# Patient Record
Sex: Male | Born: 1960 | Race: Black or African American | Hispanic: No | Marital: Single | Smoking: Current some day smoker
Health system: Southern US, Community
[De-identification: ages and names within clinical notes are randomized; demographics above are authoritative.]

## PROBLEM LIST (undated history)

## (undated) DIAGNOSIS — I10 Essential (primary) hypertension: Secondary | ICD-10-CM

## (undated) HISTORY — PX: ANTERIOR CRUCIATE LIGAMENT REPAIR: SHX115

---

## 2015-06-11 ENCOUNTER — Emergency Department (HOSPITAL_COMMUNITY): Payer: Self-pay

## 2015-06-11 ENCOUNTER — Emergency Department (HOSPITAL_COMMUNITY)
Admission: EM | Admit: 2015-06-11 | Discharge: 2015-06-11 | Disposition: A | Discharge status: Home / Self Care | Payer: Self-pay | Attending: Emergency Medicine | Admitting: Emergency Medicine

## 2015-06-11 ENCOUNTER — Encounter (HOSPITAL_COMMUNITY): Payer: Self-pay

## 2015-06-11 DIAGNOSIS — Z79899 Other long term (current) drug therapy: Secondary | ICD-10-CM | POA: Insufficient documentation

## 2015-06-11 DIAGNOSIS — F17210 Nicotine dependence, cigarettes, uncomplicated: Secondary | ICD-10-CM | POA: Insufficient documentation

## 2015-06-11 DIAGNOSIS — J329 Chronic sinusitis, unspecified: Secondary | ICD-10-CM | POA: Insufficient documentation

## 2015-06-11 DIAGNOSIS — I10 Essential (primary) hypertension: Secondary | ICD-10-CM | POA: Insufficient documentation

## 2015-06-11 MED ORDER — AZITHROMYCIN 250 MG PO TABS: ORAL_TABLET | ORAL | Status: DC

## 2015-06-11 MED ORDER — FLUTICASONE PROPIONATE 50 MCG/ACT NA SUSP
2.0000 | Freq: Every day | NASAL | Status: AC
Start: 2015-06-11 — End: ?

## 2015-06-11 NOTE — ED Provider Notes (Signed)
CSN: [id number]     Arrival date & time 06/11/15  1232 History   First MD Initiated Contact with Patient 06/11/15 1517     Chief Complaint  Patient presents with  . Facial Pain  . Cough     (Consider location/radiation/quality/duration/timing/severity/associated sxs/prior Treatment) The history is provided by the patient.  Tristan DanceKeith Kelley is a 55 y.o. male here with runny nose, cough, chills. Patient states that For the last month or so he has been having intermittent cough. He has not productive cough mostly. He does have some subjective chills at home. Has persistent runny nose with some yellow greenish mucus. He started to have some facial pressure for the last week or so. He has been trying NyQuil as well as TheraFlu and Motrin with minimal relief. Denies any actual fevers or chest pain or vomiting or abdominal pain.         History reviewed. No pertinent past medical history. Past Surgical History  Procedure Laterality Date  . Anterior cruciate ligament repair     History reviewed. No pertinent family history. Social History  Substance Use Topics  . Smoking status: Current Some Day Smoker    Types: Cigarettes  . Smokeless tobacco: None  . Alcohol Use: Yes     Comment: occ    Review of Systems  HENT: Positive for congestion and facial swelling.   Respiratory: Positive for cough.   All other systems reviewed and are negative.     Allergies  Review of patient's allergies indicates no known allergies.  Home Medications   Prior to Admission medications   Medication Sig Start Date End Date Taking? Authorizing Provider  acetaminophen (TYLENOL) 500 MG tablet Take 500 mg by mouth every 6 (six) hours as needed for mild pain.   Yes Historical Provider, MD  aspirin-sod bicarb-citric acid (ALKA-SELTZER) 325 MG TBEF tablet Take 325 mg by mouth every 6 (six) hours as needed.   Yes Historical Provider, MD  ibuprofen (ADVIL,MOTRIN) 200 MG tablet Take 800 mg by mouth every 6 (six) hours  as needed for fever.   Yes Historical Provider, MD  Pheniramine-PE-APAP (THERAFLU FLU & SORE THROAT) 20-10-650 MG PACK Take 1 Package by mouth daily.   Yes Historical Provider, MD  Phenyleph-Doxylamine-DM-APAP (NYQUIL SEVERE COLD/FLU) 5-6.25-10-325 MG/15ML LIQD Take 1 capsule by mouth daily as needed (cold symptoms).   Yes Historical Provider, MD   BP 166/108 mmHg  Pulse 55  Temp(Src) 98.1 F (36.7 C) (Oral)  Resp 20  Ht 6\' 2"  (1.88 m)  Wt 227 lb 3.2 oz (103.057 kg)  BMI 29.16 kg/m2  SpO2 100% Physical Exam  Constitutional: He is oriented to person, place, and time. He appears well-developed and well-nourished.  HENT:  Head: Normocephalic.  Right Ear: External ear normal.  Left Ear: External ear normal.  Mouth/Throat: Oropharynx is clear and moist.  + L maxillary sinus tenderness   Eyes: Conjunctivae are normal. Pupils are equal, round, and reactive to light.  Neck: Normal range of motion. Neck supple.  Cardiovascular: Normal rate, regular rhythm and normal heart sounds.   Pulmonary/Chest: Effort normal.  + diminished L side   Abdominal: Soft. Bowel sounds are normal. He exhibits no distension. There is no tenderness. There is no rebound.  Musculoskeletal: Normal range of motion. He exhibits no edema or tenderness.  Neurological: He is alert and oriented to person, place, and time. No cranial nerve deficit. Coordination normal.  Skin: Skin is warm and dry.  Psychiatric: He has a normal mood and  affect. His behavior is normal. Judgment and thought content normal.  Nursing note and vitals reviewed.   ED Course  Procedures (including critical care time) Labs Review Labs Reviewed - No data to display  Imaging Review Dg Chest 2 View  06/11/2015  CLINICAL DATA:  Cough for 4 weeks EXAM: CHEST  2 VIEW COMPARISON:  None. FINDINGS: The heart size and mediastinal contours are within normal limits. Both lungs are clear. The visualized skeletal structures are unremarkable. IMPRESSION: No  active cardiopulmonary disease. Electronically Signed   By: Alcide Clever M.D.   On: 06/11/2015 16:17   I have personally reviewed and evaluated these images and lab results as part of my medical decision-making.   EKG Interpretation None      MDM   Final diagnoses:  None    Tristan Kelley is a 55 y.o. male here with cough, sinus congestion. Likely sinusitis vs pneumonia .Will get CXR. Also hypertensive but has never been hypertensive before but doesn't have PCP. Denies chest pain or shortness of breath or abdominal pain. No signs of hypertensive urgency. Will contact case management to get him outpatient f/u for BP check.   4:50 PM CXr clear. I think likely viral sinusitis vs atypical pneumonia. He is a smoker. Will dc home with zpack, flonase. Case manager able to get him list of resources to follow up BP. BP on discharge is 166/108. Will have him recheck BP in the office in a week.     Richardean Canal, MD 06/11/15 [phone]

## 2015-06-11 NOTE — ED Notes (Signed)
Onset 4 weeks cough-yellow phlegm, runny nose- yellow green mucus, chills, sweats and facial pain over forehead and cheeks.  Symptoms worsening.  No respiratory difficulties.  Has tried several OTC meds with no relief.

## 2015-06-11 NOTE — Care Management Note (Signed)
Case Management Note  Patient Details  Name: Manon HildingKeith Sealey MRN: [id number] Date of Birth: 1961-01-20  Subjective/Objective:  55 y.o. M seen in the ED on 06/11/2015 for runny nose, cough. EDP consulted CM for PCP referral.                   Action/Plan: [place] [facility]. [facility] of need for Follow up appt by In Basket,  as well as Allene Pyo Wood, RN ED CM (M-F) so that referral can be completed. Pt also given complete list of resources in this area and instructions to call providers and assess which, if any would best suit his needs, physically and financially. CM instructed pt to visit local drug store ([facility]) where Automatic BP Cuffs are located and take BP, ( same time daily for at least 7 days, record and have on hand for first MD visit. Pt verbalized no questions or concerns.    Expected Discharge Date:                  Expected Discharge Plan:     In-House Referral:     Discharge planning Services  CM Consult, Follow-up appt scheduled, [facility]  Post Acute Care Choice:    Choice offered to:  Patient  DME Arranged:    DME Agency:     HH Arranged:    HH Agency:     Status of Service:  Completed, signed off  Medicare Important Message Given:    Date Medicare IM Given:    Medicare IM give by:    Date Additional Medicare IM Given:    Additional Medicare Important Message give by:     If discussed at Long Length of Stay Meetings, dates discussed:    Additional Comments:  Yvone NeuCrutchfield, Adaia Matthies M, RN 06/11/2015, 4:02 PM

## 2015-06-11 NOTE — Discharge Instructions (Signed)
Take zpack as prescribed.   Take flonase daily for congestion.   Stop smoking.   Recheck blood pressure in the office in a week.   Return to ER if you have fever, trouble breathing, worse congestion or sinus pain.

## 2015-06-21 ENCOUNTER — Ambulatory Visit: Payer: Self-pay | Attending: Internal Medicine | Admitting: Internal Medicine

## 2015-06-21 ENCOUNTER — Encounter: Payer: Self-pay | Admitting: Internal Medicine

## 2015-06-21 VITALS — BP 179/121 | HR 59 | Temp 98.0°F | Resp 16 | Ht 74.0 in | Wt 230.0 lb

## 2015-06-21 DIAGNOSIS — F17210 Nicotine dependence, cigarettes, uncomplicated: Secondary | ICD-10-CM | POA: Insufficient documentation

## 2015-06-21 DIAGNOSIS — Z1211 Encounter for screening for malignant neoplasm of colon: Secondary | ICD-10-CM

## 2015-06-21 DIAGNOSIS — Z79899 Other long term (current) drug therapy: Secondary | ICD-10-CM | POA: Insufficient documentation

## 2015-06-21 DIAGNOSIS — I10 Essential (primary) hypertension: Secondary | ICD-10-CM | POA: Insufficient documentation

## 2015-06-21 DIAGNOSIS — Z7982 Long term (current) use of aspirin: Secondary | ICD-10-CM | POA: Insufficient documentation

## 2015-06-21 DIAGNOSIS — Z125 Encounter for screening for malignant neoplasm of prostate: Secondary | ICD-10-CM

## 2015-06-21 MED ORDER — AMLODIPINE BESYLATE 10 MG PO TABS: 10.0000 mg | ORAL_TABLET | Freq: Every day | ORAL | Status: AC

## 2015-06-21 MED ORDER — HYDROCHLOROTHIAZIDE 25 MG PO TABS: 25.0000 mg | ORAL_TABLET | Freq: Every day | ORAL | Status: AC

## 2015-06-21 NOTE — Progress Notes (Signed)
Patient here for follow up from the hospital Was seen for elevated blood pressure Presents in office with high blood pressure But with low pulse

## 2015-06-21 NOTE — Patient Instructions (Signed)
Tristan Kelley pharm clinic 1 wk for bp [employer] packet.  Hypertension Hypertension is another name for high blood pressure. High blood pressure forces your heart to work harder to pump blood. A blood pressure reading has two numbers, which includes a higher number over a lower number (example: 110/72). HOME CARE   Have your blood pressure rechecked by your doctor.  Only take medicine as told by your doctor. Follow the directions carefully. The medicine does not work as well if you skip doses. Skipping doses also puts you at risk for problems.  Do not smoke.  Monitor your blood pressure at home as told by your doctor. GET HELP IF:  You think you are having a reaction to the medicine you are taking.  You have repeat headaches or feel dizzy.  You have puffiness (swelling) in your ankles.  You have trouble with your vision. GET HELP RIGHT AWAY IF:   You get a very bad headache and are confused.  You feel weak, numb, or faint.  You get chest or belly (abdominal) pain.  You throw up (vomit).  You cannot breathe very well. MAKE SURE YOU:   Understand these instructions.  Will watch your condition.  Will get help right away if you are not doing well or get worse.   This information is not intended to replace advice given to you by your health care provider. Make sure you discuss any questions you have with your health care provider.   Document Released: 08/21/2007 Document Revised: 03/09/2013 Document Reviewed: 12/25/2012 Elsevier Interactive Patient Education 2016 Elsevier Inc.  - DASH Eating Plan DASH stands for "Dietary Approaches to Stop Hypertension." The DASH eating plan is a healthy eating plan that has been shown to reduce high blood pressure (hypertension). Additional health benefits may include reducing the risk of type 2 diabetes mellitus, heart disease, and stroke. The DASH eating plan may also help with weight loss. WHAT DO I NEED TO KNOW ABOUT THE DASH  EATING PLAN? For the DASH eating plan, you will follow these general guidelines:  Choose foods with a percent daily value for sodium of less than 5% (as listed on the food label).  Use salt-free seasonings or herbs instead of table salt or sea salt.  Check with your health care provider or pharmacist before using salt substitutes.  Eat lower-sodium products, often labeled as "lower sodium" or "no salt added."  Eat fresh foods.  Eat more vegetables, fruits, and low-fat dairy products.  Choose whole grains. Look for the word "whole" as the first word in the ingredient list.  Choose fish and skinless chicken or [place] more often than red meat. Limit fish, poultry, and meat to 6 oz (170 g) each day.  Limit sweets, desserts, sugars, and sugary drinks.  Choose heart-healthy fats.  Limit cheese to 1 oz (28 g) per day.  Eat more home-cooked food and less restaurant, buffet, and fast food.  Limit fried foods.  Cook foods using methods other than frying.  Limit canned vegetables. If you do use them, rinse them well to decrease the sodium.  When eating at a restaurant, ask that your food be prepared with less salt, or no salt if possible. WHAT FOODS CAN I EAT? Seek help from a dietitian for individual calorie needs. Grains Whole grain or whole wheat bread. Brown rice. Whole grain or whole wheat pasta. Quinoa, bulgur, and whole grain cereals. Low-sodium cereals. Corn or whole wheat flour tortillas. Whole grain cornbread. Whole grain crackers. Low-sodium  crackers. Vegetables Fresh or frozen vegetables (raw, steamed, roasted, or grilled). Low-sodium or reduced-sodium tomato and vegetable juices. Low-sodium or reduced-sodium tomato sauce and paste. Low-sodium or reduced-sodium canned vegetables.  Fruits All fresh, canned (in natural juice), or frozen fruits. Meat and Other Protein Products Ground beef (85% or leaner), grass-fed beef, or beef trimmed of fat. Skinless chicken or [place].  Ground chicken or [place]. Pork trimmed of fat. All fish and seafood. Eggs. Dried beans, peas, or lentils. Unsalted nuts and seeds. Unsalted canned beans. Dairy Low-fat dairy products, such as skim or 1% milk, 2% or reduced-fat cheeses, low-fat ricotta or cottage cheese, or plain low-fat yogurt. Low-sodium or reduced-sodium cheeses. Fats and Oils Tub margarines without trans fats. Light or reduced-fat mayonnaise and salad dressings (reduced sodium). Avocado. Safflower, olive, or canola oils. Natural peanut or almond butter. Other Unsalted popcorn and pretzels. The items listed above may not be a complete list of recommended foods or beverages. Contact your dietitian for more options. WHAT FOODS ARE NOT RECOMMENDED? Grains White bread. White pasta. White rice. Refined cornbread. Bagels and croissants. Crackers that contain trans fat. Vegetables Creamed or fried vegetables. Vegetables in a cheese sauce. Regular canned vegetables. Regular canned tomato sauce and paste. Regular tomato and vegetable juices. Fruits Dried fruits. Canned fruit in light or heavy syrup. Fruit juice. Meat and Other Protein Products Fatty cuts of meat. Ribs, chicken wings, bacon, sausage, bologna, salami, chitterlings, fatback, hot dogs, bratwurst, and packaged luncheon meats. Salted nuts and seeds. Canned beans with salt. Dairy Whole or 2% milk, cream, half-and-half, and cream cheese. Whole-fat or sweetened yogurt. Full-fat cheeses or blue cheese. Nondairy creamers and whipped toppings. Processed cheese, cheese spreads, or cheese curds. Condiments Onion and garlic salt, seasoned salt, table salt, and sea salt. Canned and packaged gravies. Worcestershire sauce. Tartar sauce. Barbecue sauce. Teriyaki sauce. Soy sauce, including reduced sodium. Steak sauce. Fish sauce. Oyster sauce. Cocktail sauce. Horseradish. Ketchup and mustard. Meat flavorings and tenderizers. Bouillon cubes. Hot sauce. Tabasco sauce. Marinades. Taco  seasonings. Relishes. Fats and Oils Butter, stick margarine, lard, shortening, ghee, and bacon fat. Coconut, palm kernel, or palm oils. Regular salad dressings. Other Pickles and olives. Salted popcorn and pretzels. The items listed above may not be a complete list of foods and beverages to avoid. Contact your dietitian for more information. WHERE CAN I FIND MORE INFORMATION? National Heart, Lung, and Blood Institute: [employer]it   This information is not intended to replace advice given to you by your health care provider. Make sure you discuss any questions you have with your health care provider.   Document Released: 02/21/2011 Document Revised: 03/25/2014 Document Reviewed: 01/06/2013 Elsevier Interactive Patient Education [employer]

## 2015-06-21 NOTE — Progress Notes (Signed)
Tristan Kelley, is a 55 y.o. male  EAV:[id number]  [id number]:[id number]  DOB - [birth year]  CC:  Chief Complaint  Patient presents with  . Hypertension       HPI: Tristan Kelley is a 55 y.o. male here today to establish medical care.  Patient was recently seen in the ED for acute sinusitis for which she was given a Z-Pak and Flonase on 06/11/2015. He feels better now. He is asymptomatic. He still uses Flonase occasionally for nasal congestion. He was in the ED, they found he had very high blood pressures and sent him to our clinic for further follow-up. 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  He wants to take care of himself better since he is getting older now.  He is interested in colonoscopy and PSA testing, wants to hold off on prostate exam at this time.  Smokes about 1 .5 pack tobacco a week.  Patient has No headache, No chest pain, No abdominal pain - No Nausea, No new weakness tingling or numbness, No Cough - SOB.  No Known Allergies History reviewed. No pertinent past medical history. Current Outpatient Prescriptions on File Prior to Visit  Medication Sig Dispense Refill  . acetaminophen (TYLENOL) 500 MG tablet Take 500 mg by mouth every 6 (six) hours as needed for mild pain.    Marland Kitchen aspirin-sod bicarb-citric acid (ALKA-SELTZER) 325 MG TBEF tablet Take 325 mg by mouth every 6 (six) hours as needed.    Marland Kitchen azithromycin (ZITHROMAX Z-PAK) 250 MG tablet 2 po day one, then 1 daily x 4 days 6 tablet 0  . fluticasone (FLONASE) 50 MCG/ACT nasal spray Place 2 sprays into both nostrils daily. 16 g 0  . ibuprofen (ADVIL,MOTRIN) 200 MG tablet Take 800 mg by mouth every 6 (six) hours as needed for fever.    . Pheniramine-PE-APAP (THERAFLU FLU & SORE THROAT) 20-10-650 MG PACK Take 1 Package by mouth daily.    Marland Kitchen Phenyleph-Doxylamine-DM-APAP (NYQUIL SEVERE COLD/FLU) 5-6.25-10-325 MG/15ML LIQD Take 1 capsule by mouth daily as needed  (cold symptoms).     No current facility-administered medications on file prior to visit.   History reviewed. No pertinent family history. Social History   Social History  . Marital Status: Single    Spouse Name: N/A  . Number of Children: N/A  . Years of Education: N/A   Occupational History  . Not on file.   Social History Main Topics  . Smoking status: Current Some Day Smoker    Types: Cigarettes  . Smokeless tobacco: Not on file  . Alcohol Use: Yes     Comment: occ  . Drug Use: No  . Sexual Activity: Not on file   Other Topics Concern  . Not on file   Social History Narrative    Review of Systems: Constitutional: Negative for fever, chills, diaphoresis, activity change, appetite change and fatigue. HENT: Negative for ear pain, nosebleeds, congestion, facial swelling, rhinorrhea, neck pain, neck stiffness and ear discharge.  Eyes: Negative for pain, discharge, redness, itching and visual disturbance. Respiratory: Negative for cough, choking, chest tightness, shortness of breath, wheezing and stridor.  Cardiovascular: Negative for chest pain, palpitations and leg swelling. Gastrointestinal: Negative for abdominal distention. Genitourinary: Negative for dysuria, urgency, frequency, hematuria, flank pain, decreased urine volume, difficulty urinating and dyspareunia.  Musculoskeletal: Negative for back pain, joint swelling, arthralgia and gait problem. Neurological: Negative for dizziness, tremors, seizures, syncope, facial asymmetry, speech  difficulty, weakness, light-headedness, numbness and headaches.  Hematological: Negative for adenopathy. Does not bruise/bleed easily. Psychiatric/Behavioral: Negative for hallucinations, behavioral problems, confusion, dysphoric mood, decreased concentration and agitation.    Objective:   Filed Vitals:   06/21/15 1029 06/21/15 1100  BP: 192/124 179/121  Pulse: 60 59  Temp: 98 F (36.7 C)   Resp: 16    bmi 29.5  Physical  Exam: Constitutional: Patient appears well-developed and well-nourished. No distress. AAOx3, AAM, pleasant HENT: Normocephalic, atraumatic, External right and left ear normal. Oropharynx is clear and moist.  Eyes: Conjunctivae and EOM are normal. PERRL, no scleral icterus. Neck: Normal ROM. Neck supple. No JVD. No tracheal deviation.  CVS: RRR, S1/S2 +, no murmurs, no gallops, no carotid bruit.  Pulmonary: Effort and breath sounds normal, no stridor, rhonchi, wheezes, rales.  Abdominal: Soft. BS +, no distension, tenderness, rebound or guarding.  Musculoskeletal: Normal range of motion. No edema and no tenderness.  Lymphadenopathy: No lymphadenopathy noted, cervical, Neuro: Alert.  muscle tone coordination. No cranial nerve deficit grossly. Skin: Skin is warm and dry. No rash noted. Not diaphoretic. No erythema. No pallor. Psychiatric: Normal mood and affect. Behavior, judgment, thought content normal.  No results found for: WBC, HGB, HCT, MCV, PLT No results found for: CREATININE, BUN, NA, K, CL, CO2  No results found for: HGBA1C Lipid Panel  No results found for: CHOL, TRIG, HDL, CHOLHDL, VLDL, LDLCALC     Assessment and plan:   1. HTN (hypertension), malignant - will start on norvasc 10 and hctz 25 qd - [facility]  1 wk for bp chk - chk bmp, lipid panel, cbc - We have discussed target BP range and blood pressure goal. -  I have advised patient to check BP regularly and to call us back or report to clinic if the numbers are consistently higher than 140/90.  - We discussed the importance of compliance with medical therapy and DASH diet recommended, consequences of uncontrolled hypertension discussed.    2. Prostate cancer screening - pt declining rectal exam at this time; PSA testing discussed w/ patient, including sens/specificitiy, and he is willing to get tested with PSA at this time.  3. Colon cancer screening - Ambulatory referral to Gastroenterology -  colonoscopy  4. Sinusitis - resolved  Financial services, need orange card. chk vit D levels as well, health maintenance    Return in about 3 months (around 09/20/2015).  The patient was given clear instructions to go to ER or return to medical center if symptoms don't improve, worsen or new problems develop. The patient verbalized understanding. The patient was told to call to get lab results if they haven't heard anything in the next week.      Pete Glatterawn T Ladonte Verstraete, MD, MBA/MHA [facility] [place] [phone]   06/21/2015, 1:57 PM

## 2015-06-28 ENCOUNTER — Encounter: Payer: Self-pay | Admitting: Pharmacist

## 2015-06-28 ENCOUNTER — Ambulatory Visit: Payer: Self-pay | Attending: Internal Medicine | Admitting: Pharmacist

## 2015-06-28 VITALS — BP 131/87 | HR 64

## 2015-06-28 DIAGNOSIS — I10 Essential (primary) hypertension: Secondary | ICD-10-CM

## 2015-06-28 NOTE — Patient Instructions (Signed)
Thanks for coming to see Tristan Kelley!  Continue to take your medications as prescribed  Follow up with Dr. Julien NordmannLangeland in 3 months  DASH Eating Plan DASH stands for "Dietary Approaches to Stop Hypertension." The DASH eating plan is a healthy eating plan that has been shown to reduce high blood pressure (hypertension). Additional health benefits may include reducing the risk of type 2 diabetes mellitus, heart disease, and stroke. The DASH eating plan may also help with weight loss. WHAT DO I NEED TO KNOW ABOUT THE DASH EATING PLAN? For the DASH eating plan, you will follow these general guidelines:  Choose foods with a percent daily value for sodium of less than 5% (as listed on the food label).  Use salt-free seasonings or herbs instead of table salt or sea salt.  Check with your health care provider or pharmacist before using salt substitutes.  Eat lower-sodium products, often labeled as "lower sodium" or "no salt added."  Eat fresh foods.  Eat more vegetables, fruits, and low-fat dairy products.  Choose whole grains. Look for the word "whole" as the first word in the ingredient list.  Choose fish and skinless chicken or [place] more often than red meat. Limit fish, poultry, and meat to 6 oz (170 g) each day.  Limit sweets, desserts, sugars, and sugary drinks.  Choose heart-healthy fats.  Limit cheese to 1 oz (28 g) per day.  Eat more home-cooked food and less restaurant, buffet, and fast food.  Limit fried foods.  Cook foods using methods other than frying.  Limit canned vegetables. If you do use them, rinse them well to decrease the sodium.  When eating at a restaurant, ask that your food be prepared with less salt, or no salt if possible. WHAT FOODS CAN I EAT? Seek help from a dietitian for individual calorie needs. Grains Whole grain or whole wheat bread. Brown rice. Whole grain or whole wheat pasta. Quinoa, bulgur, and whole grain cereals. Low-sodium cereals. Corn or whole  wheat flour tortillas. Whole grain cornbread. Whole grain crackers. Low-sodium crackers. Vegetables Fresh or frozen vegetables (raw, steamed, roasted, or grilled). Low-sodium or reduced-sodium tomato and vegetable juices. Low-sodium or reduced-sodium tomato sauce and paste. Low-sodium or reduced-sodium canned vegetables.  Fruits All fresh, canned (in natural juice), or frozen fruits. Meat and Other Protein Products Ground beef (85% or leaner), grass-fed beef, or beef trimmed of fat. Skinless chicken or [place]. Ground chicken or [place]. Pork trimmed of fat. All fish and seafood. Eggs. Dried beans, peas, or lentils. Unsalted nuts and seeds. Unsalted canned beans. Dairy Low-fat dairy products, such as skim or 1% milk, 2% or reduced-fat cheeses, low-fat ricotta or cottage cheese, or plain low-fat yogurt. Low-sodium or reduced-sodium cheeses. Fats and Oils Tub margarines without trans fats. Light or reduced-fat mayonnaise and salad dressings (reduced sodium). Avocado. Safflower, olive, or canola oils. Natural peanut or almond butter. Other Unsalted popcorn and pretzels. The items listed above may not be a complete list of recommended foods or beverages. Contact your dietitian for more options. WHAT FOODS ARE NOT RECOMMENDED? Grains White bread. White pasta. White rice. Refined cornbread. Bagels and croissants. Crackers that contain trans fat. Vegetables Creamed or fried vegetables. Vegetables in a cheese sauce. Regular canned vegetables. Regular canned tomato sauce and paste. Regular tomato and vegetable juices. Fruits Dried fruits. Canned fruit in light or heavy syrup. Fruit juice. Meat and Other Protein Products Fatty cuts of meat. Ribs, chicken wings, bacon, sausage, bologna, salami, chitterlings, fatback, hot dogs, bratwurst, and packaged luncheon  meats. Salted nuts and seeds. Canned beans with salt. Dairy Whole or 2% milk, cream, half-and-half, and cream cheese. Whole-fat or sweetened yogurt.  Full-fat cheeses or blue cheese. Nondairy creamers and whipped toppings. Processed cheese, cheese spreads, or cheese curds. Condiments Onion and garlic salt, seasoned salt, table salt, and sea salt. Canned and packaged gravies. Worcestershire sauce. Tartar sauce. Barbecue sauce. Teriyaki sauce. Soy sauce, including reduced sodium. Steak sauce. Fish sauce. Oyster sauce. Cocktail sauce. Horseradish. Ketchup and mustard. Meat flavorings and tenderizers. Bouillon cubes. Hot sauce. Tabasco sauce. Marinades. Taco seasonings. Relishes. Fats and Oils Butter, stick margarine, lard, shortening, ghee, and bacon fat. Coconut, palm kernel, or palm oils. Regular salad dressings. Other Pickles and olives. Salted popcorn and pretzels. The items listed above may not be a complete list of foods and beverages to avoid. Contact your dietitian for more information. WHERE CAN I FIND MORE INFORMATION? National Heart, Lung, and Blood Institute: [employer]   This information is not intended to replace advice given to you by your health care provider. Make sure you discuss any questions you have with your health care provider.   Document Released: 02/21/2011 Document Revised: 03/25/2014 Document Reviewed: 01/06/2013 Elsevier Interactive Patient Education 2016 [employer]. Hypertension Hypertension is another name for high blood pressure. High blood pressure forces your heart to work harder to pump blood. A blood pressure reading has two numbers, which includes a higher number over a lower number (example: 110/72). HOME CARE   Have your blood pressure rechecked by your doctor.  Only take medicine as told by your doctor. Follow the directions carefully. The medicine does not work as well if you skip doses. Skipping doses also puts you at risk for problems.  Do not smoke.  Monitor your blood pressure at home as told by your doctor. GET HELP IF:  You think you are having a  reaction to the medicine you are taking.  You have repeat headaches or feel dizzy.  You have puffiness (swelling) in your ankles.  You have trouble with your vision. GET HELP RIGHT AWAY IF:   You get a very bad headache and are confused.  You feel weak, numb, or faint.  You get chest or belly (abdominal) pain.  You throw up (vomit).  You cannot breathe very well. MAKE SURE YOU:   Understand these instructions.  Will watch your condition.  Will get help right away if you are not doing well or get worse.   This information is not intended to replace advice given to you by your health care provider. Make sure you discuss any questions you have with your health care provider.   Document Released: 08/21/2007 Document Revised: 03/09/2013 Document Reviewed: 12/25/2012 Elsevier Interactive Patient Education [employer]

## 2015-06-28 NOTE — Progress Notes (Signed)
S:    Patient arrives in good spirits.    Presents to the clinic for hypertension evaluation. Patient was referred on 06/21/15 by Dr. Julien NordmannLangeland.  Patient was last seen by Primary Care Provider on 06/21/15.   Patient reports adherence with medications.  Current BP Medications include:  Amlodipine 10 mg daily and HCTZ 25 mg daily.     O:   Last 3 Office BP readings: BP Readings from Last 3 Encounters:  06/28/15 131/87  06/21/15 179/121  06/11/15 173/95    BMET No results found for: NA, K, CL, CO2, GLUCOSE, BUN, CREATININE, CALCIUM, GFRNONAA, GFRAA  A/P: Hypertension newly diagnosed currently controlled on current medications.  Continued amlodipine 10 mg daily and HCTZ 25 mg daily.   Results reviewed and written information provided on hypertension and the DASH diet.   Total time in face-to-face counseling 10 minutes.   F/U Clinic Visit with Dr. Julien NordmannLangeland as directed.  Patient seen with Theresa MulliganMartin Shaughnessy, PharmD Candidate

## 2015-07-03 ENCOUNTER — Telehealth: Payer: Self-pay | Admitting: Internal Medicine

## 2015-07-03 NOTE — Telephone Encounter (Signed)
Talked to his mom, pt was not home.  He went to ED on Fri 4/14 after called w/ RN for s/sx weakness/dizzy.  Per mom he is feeling better.  2) 2nd call: Gave me his cell/ [phone], I was able to talk to him.  [facility], found to be hypertensive, gave him a rx for clonidine as well, prn.  He is doing much better, has a blood pressure cuff now. I instructed him to continue taking the norvasc 10 and hctz 5, only take the clonidine as needed if his sbp remains elavated >180s after 30mins of taking his oral scheduled meds. He will make appt w/ Staci RighterStacey Pharm or myself in next week.

## 2015-09-05 ENCOUNTER — Emergency Department (HOSPITAL_COMMUNITY): Payer: Self-pay

## 2015-09-05 ENCOUNTER — Encounter (HOSPITAL_COMMUNITY): Payer: Self-pay | Admitting: Emergency Medicine

## 2015-09-05 ENCOUNTER — Emergency Department (HOSPITAL_COMMUNITY)
Admission: EM | Admit: 2015-09-05 | Discharge: 2015-09-06 | Disposition: A | Discharge status: Home / Self Care | Payer: Self-pay | Attending: Emergency Medicine | Admitting: Emergency Medicine

## 2015-09-05 DIAGNOSIS — Y93G3 Activity, cooking and baking: Secondary | ICD-10-CM | POA: Insufficient documentation

## 2015-09-05 DIAGNOSIS — I10 Essential (primary) hypertension: Secondary | ICD-10-CM | POA: Insufficient documentation

## 2015-09-05 DIAGNOSIS — Z7982 Long term (current) use of aspirin: Secondary | ICD-10-CM | POA: Insufficient documentation

## 2015-09-05 DIAGNOSIS — S61216A Laceration without foreign body of right little finger without damage to nail, initial encounter: Secondary | ICD-10-CM | POA: Insufficient documentation

## 2015-09-05 DIAGNOSIS — S41111A Laceration without foreign body of right upper arm, initial encounter: Secondary | ICD-10-CM

## 2015-09-05 DIAGNOSIS — Y929 Unspecified place or not applicable: Secondary | ICD-10-CM | POA: Insufficient documentation

## 2015-09-05 DIAGNOSIS — Y990 Civilian activity done for income or pay: Secondary | ICD-10-CM | POA: Insufficient documentation

## 2015-09-05 DIAGNOSIS — F17210 Nicotine dependence, cigarettes, uncomplicated: Secondary | ICD-10-CM | POA: Insufficient documentation

## 2015-09-05 DIAGNOSIS — W260XXA Contact with knife, initial encounter: Secondary | ICD-10-CM | POA: Insufficient documentation

## 2015-09-05 HISTORY — DX: Essential (primary) hypertension: I10

## 2015-09-05 MED ORDER — TETANUS-DIPHTH-ACELL PERTUSSIS 5-2.5-18.5 LF-MCG/0.5 IM SUSP
0.5000 mL | Freq: Once | INTRAMUSCULAR | Status: AC
  Administered 2015-09-05: 0.5 mL via INTRAMUSCULAR
  Filled 2015-09-05: qty 0.5

## 2015-09-05 MED ORDER — LIDOCAINE HCL 2 % IJ SOLN
10.0000 mL | Freq: Once | INTRAMUSCULAR | Status: AC
  Administered 2015-09-05: 200 mg
  Filled 2015-09-05: qty 20

## 2015-09-05 NOTE — ED Notes (Signed)
Pt st's he was slicing a piece of roast beef and accidentally cut his finger.  Pt has lac to tip of right 5th finger

## 2015-09-05 NOTE — ED Notes (Signed)
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 Unknown tetanus. Pt in NAD, bleeding controlled. Pt laceration to R pinkie finger. Full ROM and sensation intact

## 2015-09-05 NOTE — ED Provider Notes (Signed)
CSN: [id number]     Arrival date & time 09/05/15  2251 History   By signing my name below, I, Marisue Humble, attest that this documentation has been prepared under the direction and in the presence of non-physician practitioner, Melton Krebs, [place]. Electronically Signed: Marisue Humble, Scribe. 09/05/2015. 11:55 PM.   Chief Complaint  Patient presents with  . Extremity Laceration    The history is provided by the patient. No language interpreter was used.   HPI Comments:  Tristan Kelley is a 55 y.o. male with PMHx of HTN who presents to the Emergency Department complaining of laceration to tip of right pinky finger. Pt sliced his finger just PTA with a knife while cutting meat. He states the nail may have come up. Bleeding controlled PTA. Tetanus out of date.  Past Medical History  Diagnosis Date  . Hypertension    Past Surgical History  Procedure Laterality Date  . Anterior cruciate ligament repair     No family history on file. Social History  Substance Use Topics  . Smoking status: Current Some Day Smoker    Types: Cigarettes  . Smokeless tobacco: None  . Alcohol Use: Yes     Comment: occ    Review of Systems 10 systems reviewed and all are negative for acute change except as noted in the HPI.   Allergies  Review of patient's allergies indicates no known allergies.  Home Medications   Prior to Admission medications   Medication Sig Start Date End Date Taking? Authorizing Provider  acetaminophen (TYLENOL) 500 MG tablet Take 500 mg by mouth every 6 (six) hours as needed for mild pain.    Historical Provider, MD  amLODipine (NORVASC) 10 MG tablet Take 1 tablet (10 mg total) by mouth daily. 06/21/15   Pete Glatter, MD  aspirin-sod bicarb-citric acid (ALKA-SELTZER) 325 MG TBEF tablet Take 325 mg by mouth every 6 (six) hours as needed.    Historical Provider, MD  fluticasone (FLONASE) 50 MCG/ACT nasal spray Place 2 sprays into both nostrils daily. 06/11/15   Richardean Canal, MD  hydrochlorothiazide (HYDRODIURIL) 25 MG tablet Take 1 tablet (25 mg total) by mouth daily. 06/21/15   Pete Glatter, MD  ibuprofen (ADVIL,MOTRIN) 200 MG tablet Take 800 mg by mouth every 6 (six) hours as needed for fever. Reported on 06/28/2015    Historical Provider, MD  Pheniramine-PE-APAP (THERAFLU FLU & SORE THROAT) 20-10-650 MG PACK Take 1 Package by mouth daily. Reported on 06/28/2015    Historical Provider, MD  Phenyleph-Doxylamine-DM-APAP (NYQUIL SEVERE COLD/FLU) 5-6.25-10-325 MG/15ML LIQD Take 1 capsule by mouth daily as needed (cold symptoms). Reported on 06/28/2015    Historical Provider, MD   BP 149/102 mmHg  Pulse 68  Temp(Src) 98.2 F (36.8 C) (Oral)  Resp 18  SpO2 97%   Physical Exam  Constitutional: He is oriented to person, place, and time. He appears well-developed and well-nourished.  HENT:  Head: Normocephalic and atraumatic.  Eyes: EOM are normal.  Neck: Normal range of motion.  Cardiovascular: Normal rate.   Pulmonary/Chest: Effort normal. No respiratory distress.  Abdominal: He exhibits no distension.  Musculoskeletal: Normal range of motion.  NVI BL  Neurological: He is alert and oriented to person, place, and time.  Skin: Skin is warm and dry. No rash noted. No erythema.  3.5 cm laceration at right 5th digit. No nailbed involvement. Bleeding controlled.   Psychiatric: He has a normal mood and affect.  Nursing note and vitals reviewed.  ED Course  .Marland Kitchen.Laceration Repair Date/Time: 09/05/2015 11:45 PM Performed by: Althea GrimmerILEY, Sagan Maselli NICOLE Authorized by: Melton KrebsILEY, Callan Norden NICOLE Consent: Verbal consent obtained. Risks and benefits: risks, benefits and alternatives were discussed Consent given by: patient Patient understanding: patient states understanding of the procedure being performed Patient identity confirmed: verbally with patient and arm band Body area: upper extremity Location details: right small finger Laceration length: 3.5 cm Foreign  bodies: no foreign bodies Tendon involvement: none Nerve involvement: none Vascular damage: no Anesthesia: digital block Local anesthetic: lidocaine 2% without epinephrine Anesthetic total: 6 ml Patient sedated: no Preparation: Patient was prepped and draped in the usual sterile fashion. Irrigation solution: saline (1 liter NS) Irrigation method: 60 cc syringe with combiguard. Amount of cleaning: extensive Skin closure: 5-0 Prolene Number of sutures: 7 Technique: simple Approximation: close Approximation difficulty: simple Dressing: antibiotic ointment and gauze roll Patient tolerance: Patient tolerated the procedure well with no immediate complications  Irrigation Date/Time: 09/05/2015 11:30 PM Performed by: Althea GrimmerILEY, Jalasia Eskridge NICOLE Authorized by: Althea GrimmerILEY, Worth Kober NICOLE Consent: Verbal consent obtained. Risks and benefits: risks, benefits and alternatives were discussed Consent given by: patient Patient understanding: patient states understanding of the procedure being performed Patient identity confirmed: verbally with patient and arm band Local anesthesia used: yes Anesthesia: digital block Local anesthetic: lidocaine 2% without epinephrine Patient sedated: no Patient tolerance: Patient tolerated the procedure well with no immediate complications     DIAGNOSTIC STUDIES: Oxygen Saturation is 97% on RA, normal by my interpretation.    COORDINATION OF CARE: 11:13 PM Will order x-ray of right pinky finger and repair laceration. Discussed treatment plan with pt at bedside and pt agreed to plan.  Imaging Review Dg Finger Little Right  09/05/2015  CLINICAL DATA:  Cut distal rt little finger phalanx with knife while cutting meat EXAM: RIGHT LITTLE FINGER 2+V COMPARISON:  None. FINDINGS: There is a linear cortical fragment anterior to the tuft of the right little finger. Regional soft tissue swelling. No involvement of the subchondral cortex. Otherwise normal mineralization and  alignment. No significant osseous degenerative change. No dislocation. IMPRESSION: Cortical injury at the palmar aspect of the tuft distal phalanx right little finger. Electronically Signed   By: Corlis Leak  Hassell M.D.   On: 09/05/2015 23:41   I have personally reviewed and evaluated these images and lab results as part of my medical decision-making.  MDM   Final diagnoses:  Laceration of upper extremity, right, initial encounter   Patient with open fracture. Consulted Dr. Merlyn LotKuzma who advised bactrim and office follow-up.   Pressure irrigation performed. Wound explored and base of wound visualized in a bloodless field without evidence of foreign body.  Laceration occurred < 8 hours prior to repair which was well tolerated. Tdap updated.  Pt has no comorbidities to effect normal wound healing.  Discussed suture home care with patient and answered questions. Patient may be safely discharged home with bactrim. Discussed reasons for return. Patient to follow-up with Dr. Merrilee SeashoreKuzma's office tomorrow. Patient in understanding and agreement with the plan. I personally performed the services described in this documentation, which was scribed in my presence. The recorded information has been reviewed and is accurate.   Melton KrebsSamantha Nicole Tuyen Uncapher, PA-C 09/06/15 2221  Margarita Grizzleanielle Ray, MD 09/08/15 1247

## 2015-09-06 MED ORDER — SULFAMETHOXAZOLE-TRIMETHOPRIM 800-160 MG PO TABS
1.0000 | ORAL_TABLET | Freq: Two times a day (BID) | ORAL | Status: AC
Start: 2015-09-06 — End: 2015-09-13

## 2015-09-06 NOTE — Discharge Instructions (Signed)
Mr. Tristan Kelley,  Nice meeting you! Please follow-up with Dr. Merlyn LotKuzma. Return to the emergency department if you develop fevers, chills, swelling, numbness/tingling, inability to move your finger. Feel better soon!  S. Lane HackerNicole Annelisa Ryback, PA-C Laceration Care, Adult A laceration is a cut that goes through all of the layers of the skin and into the tissue that is right under the skin. Some lacerations heal on their own. Others need to be closed with stitches (sutures), staples, skin adhesive strips, or skin glue. Proper laceration care minimizes the risk of infection and helps the laceration to heal better. HOW TO CARE FOR YOUR LACERATION If sutures or staples were used:  Keep the wound clean and dry.  If you were given a bandage (dressing), you should change it at least one time per day or as told by your health care provider. You should also change it if it becomes wet or dirty.  Keep the wound completely dry for the first 24 hours or as told by your health care provider. After that time, you may shower or bathe. However, make sure that the wound is not soaked in water until after the sutures or staples have been removed.  Clean the wound one time each day or as told by your health care provider:  Wash the wound with soap and water.  Rinse the wound with water to remove all soap.  Pat the wound dry with a clean towel. Do not rub the wound.  After cleaning the wound, apply a thin layer of antibiotic ointmentas told by your health care provider. This will help to prevent infection and keep the dressing from sticking to the wound.  Have the sutures or staples removed as told by your health care provider. If skin adhesive strips were used:  Keep the wound clean and dry.  If you were given a bandage (dressing), you should change it at least one time per day or as told by your health care provider. You should also change it if it becomes dirty or wet.  Do not get the skin adhesive strips wet.  You may shower or bathe, but be careful to keep the wound dry.  If the wound gets wet, pat it dry with a clean towel. Do not rub the wound.  Skin adhesive strips fall off on their own. You may trim the strips as the wound heals. Do not remove skin adhesive strips that are still stuck to the wound. They will fall off in time. If skin glue was used:  Try to keep the wound dry, but you may briefly wet it in the shower or bath. Do not soak the wound in water, such as by swimming.  After you have showered or bathed, gently pat the wound dry with a clean towel. Do not rub the wound.  Do not do any activities that will make you sweat heavily until the skin glue has fallen off on its own.  Do not apply liquid, cream, or ointment medicine to the wound while the skin glue is in place. Using those may loosen the film before the wound has healed.  If you were given a bandage (dressing), you should change it at least one time per day or as told by your health care provider. You should also change it if it becomes dirty or wet.  If a dressing is placed over the wound, be careful not to apply tape directly over the skin glue. Doing that may cause the glue to be pulled  off before the wound has healed.  Do not pick at the glue. The skin glue usually remains in place for 5-10 days, then it falls off of the skin. General Instructions  Take over-the-counter and prescription medicines only as told by your health care provider.  If you were prescribed an antibiotic medicine or ointment, take or apply it as told by your doctor. Do not stop using it even if your condition improves.  To help prevent scarring, make sure to cover your wound with sunscreen whenever you are outside after stitches are removed, after adhesive strips are removed, or when glue remains in place and the wound is healed. Make sure to wear a sunscreen of at least 30 SPF.  Do not scratch or pick at the wound.  Keep all follow-up visits as  told by your health care provider. This is important.  Check your wound every day for signs of infection. Watch for:  Redness, swelling, or pain.  Fluid, blood, or pus.  Raise (elevate) the injured area above the level of your heart while you are sitting or lying down, if possible. SEEK MEDICAL CARE IF:  You received a tetanus shot and you have swelling, severe pain, redness, or bleeding at the injection site.  You have a fever.  A wound that was closed breaks open.  You notice a bad smell coming from your wound or your dressing.  You notice something coming out of the wound, such as wood or glass.  Your pain is not controlled with medicine.  You have increased redness, swelling, or pain at the site of your wound.  You have fluid, blood, or pus coming from your wound.  You notice a change in the color of your skin near your wound.  You need to change the dressing frequently due to fluid, blood, or pus draining from the wound.  You develop a new rash.  You develop numbness around the wound. SEEK IMMEDIATE MEDICAL CARE IF:  You develop severe swelling around the wound.  Your pain suddenly increases and is severe.  You develop painful lumps near the wound or on skin that is anywhere on your body.  You have a red streak going away from your wound.  The wound is on your hand or foot and you cannot properly move a finger or toe.  The wound is on your hand or foot and you notice that your fingers or toes look pale or bluish.   This information is not intended to replace advice given to you by your health care provider. Make sure you discuss any questions you have with your health care provider.   Document Released: 03/04/2005 Document Revised: 07/19/2014 Document Reviewed: 02/28/2014 Elsevier Interactive Patient Education [employer]

## 2017-04-11 IMAGING — DX DG CHEST 2V
2 series · 2 of 2 positions shown · non-contrast
Comparison: None.

CLINICAL DATA: Cough for 4 weeks

EXAM:
CHEST  2 VIEW

[chest pa]
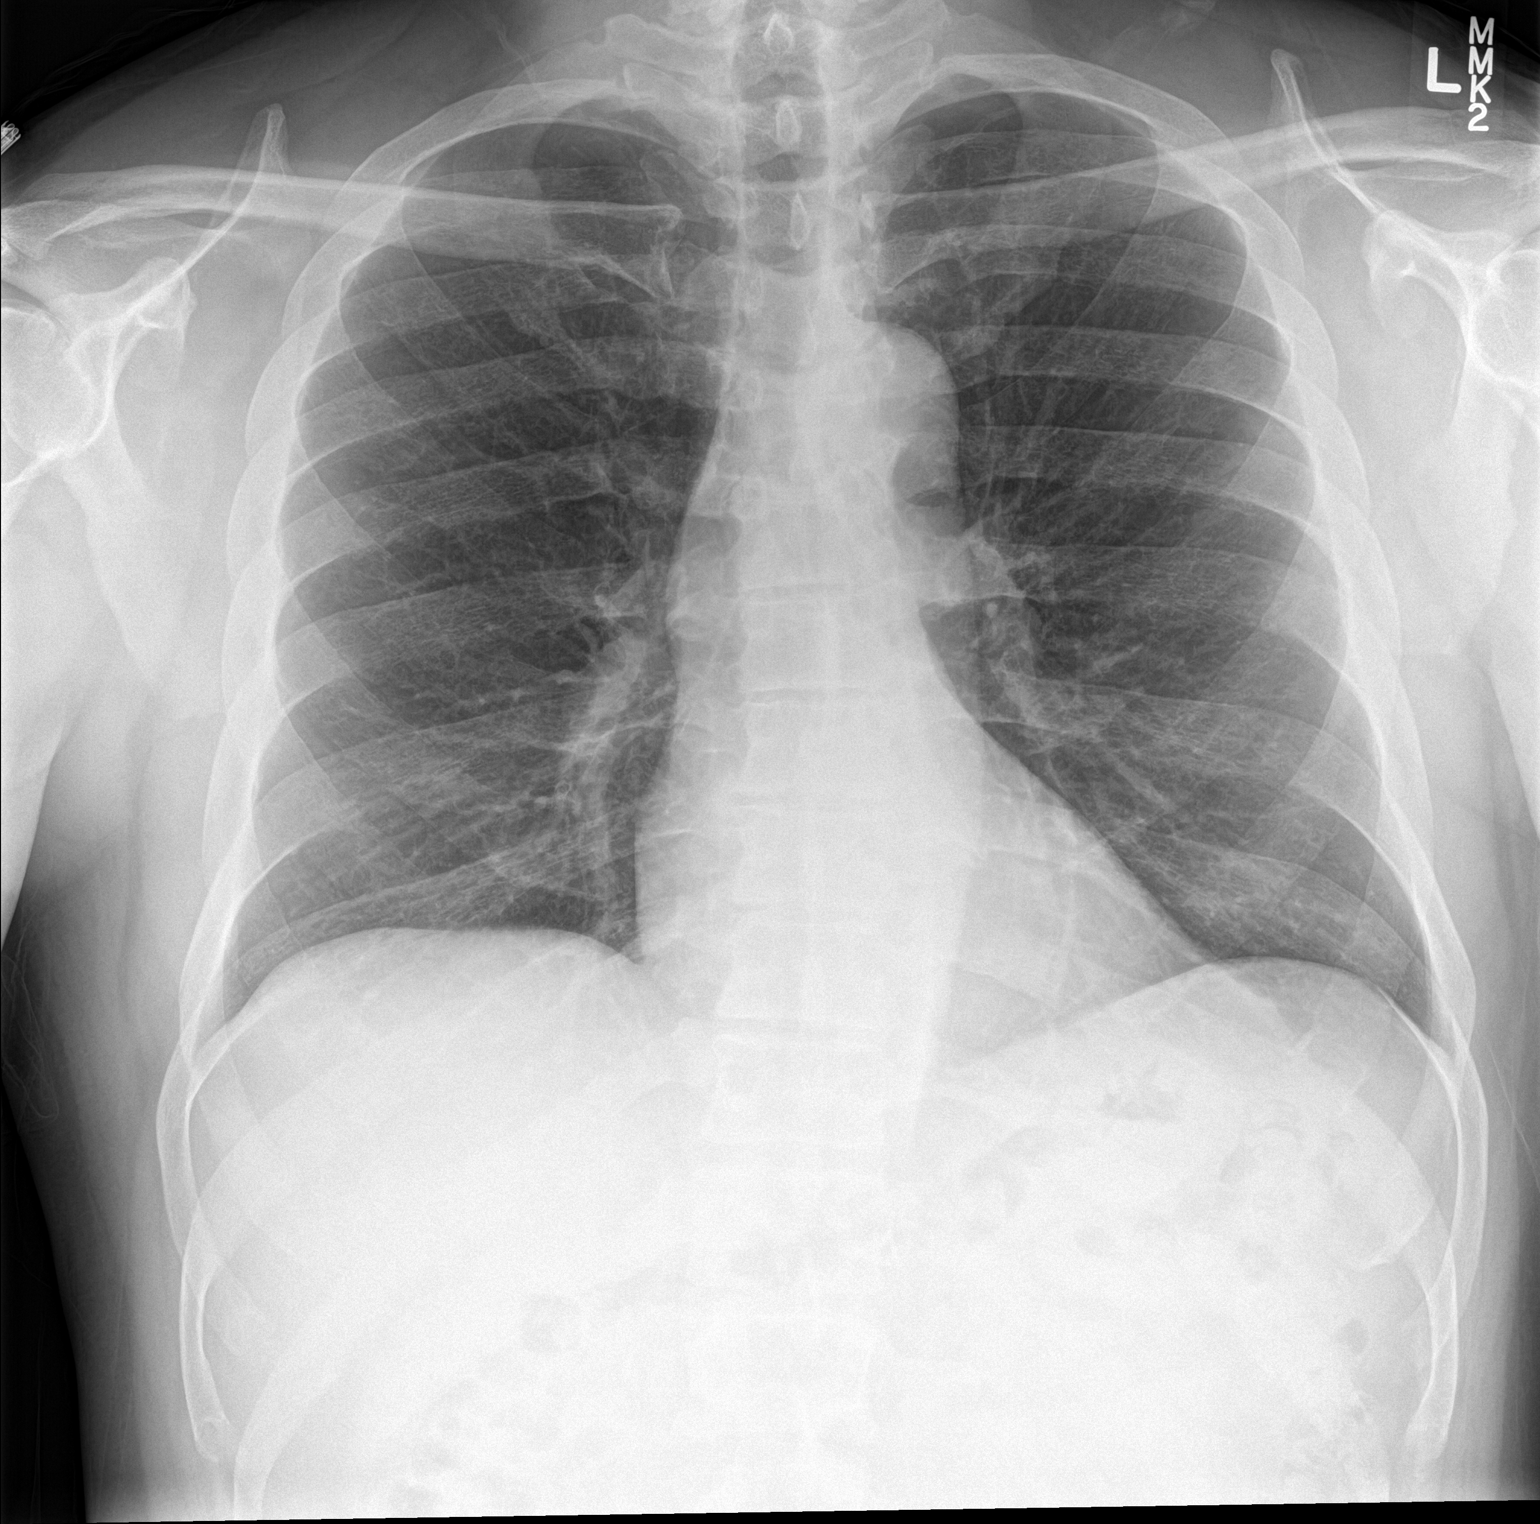

[chest lat]
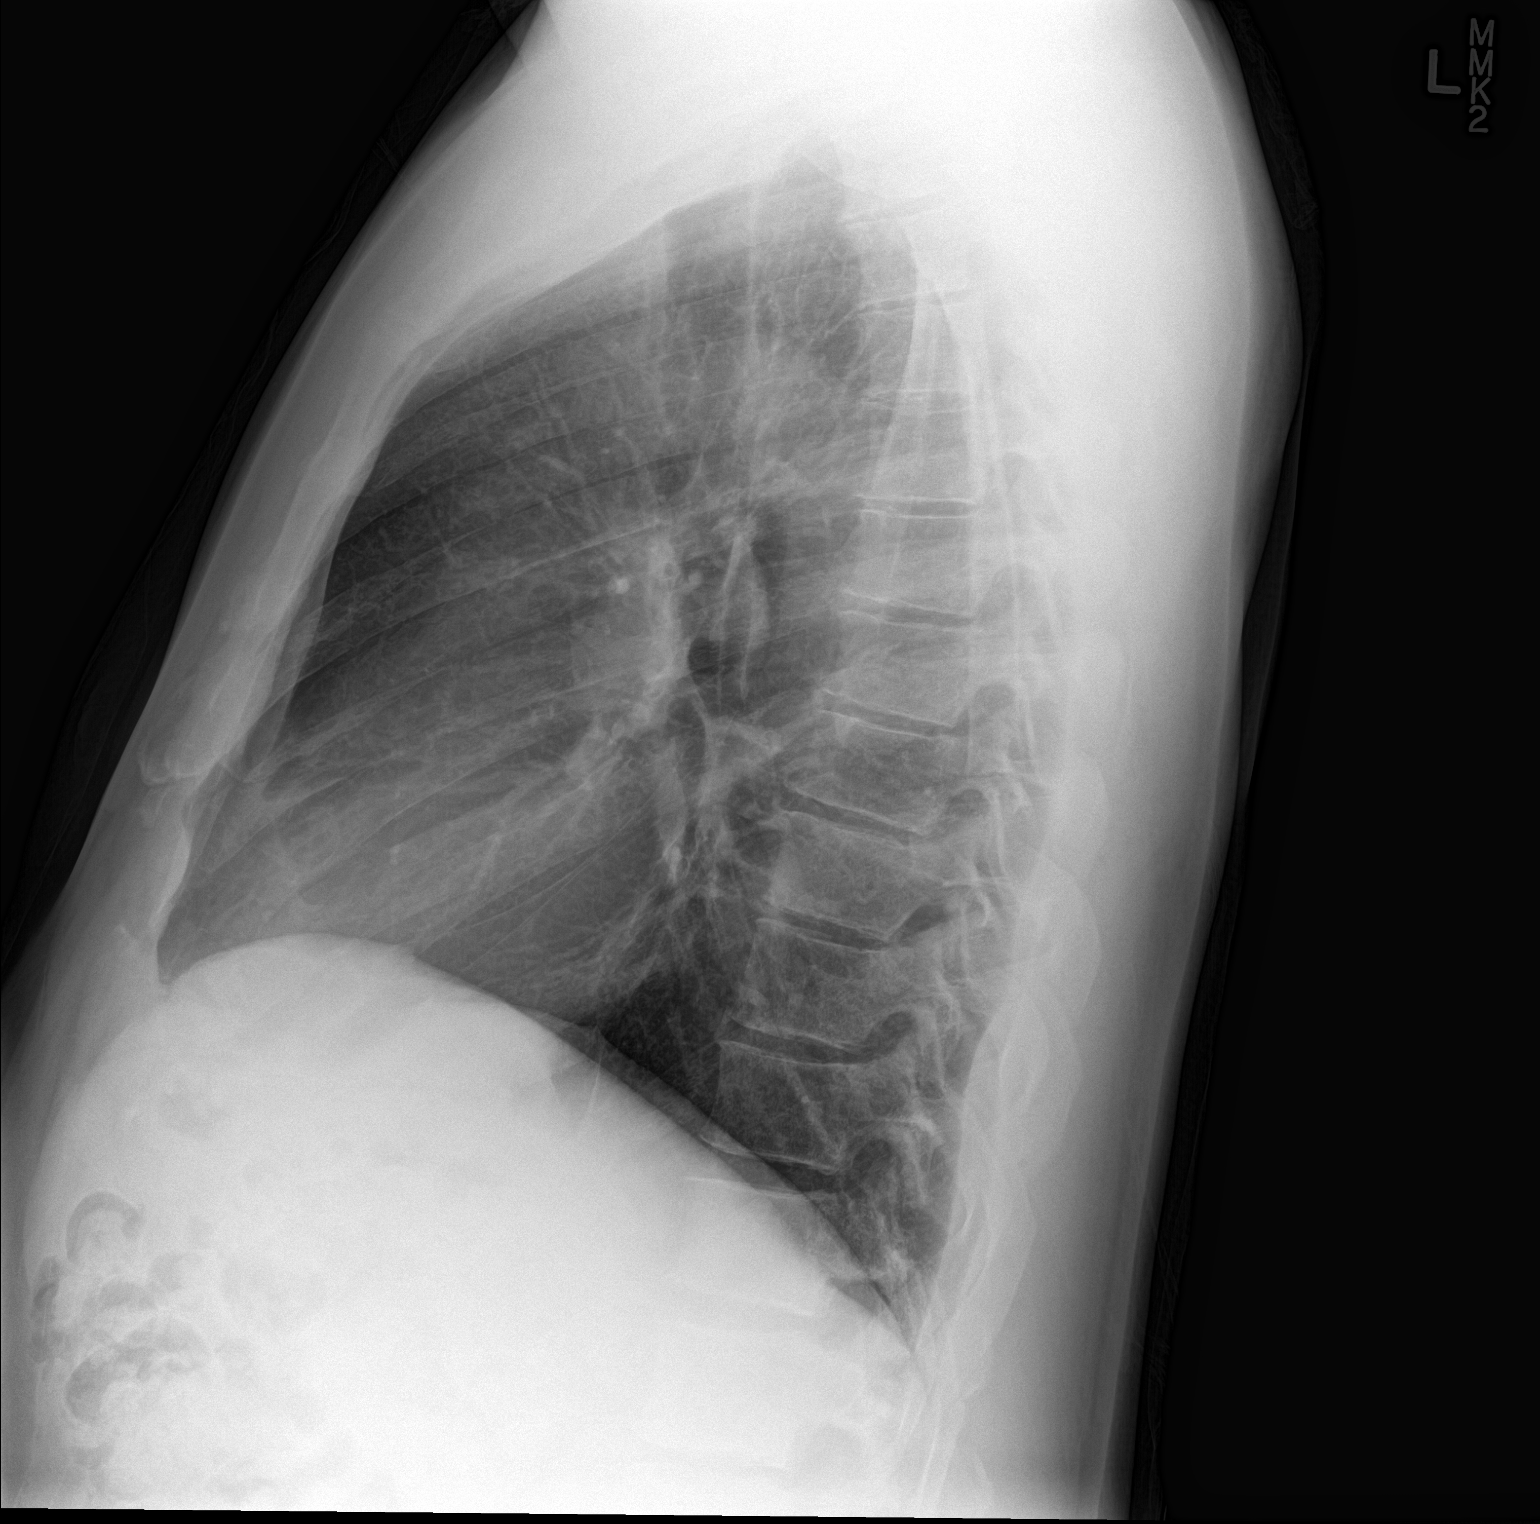

[2 of 2 positions shown; findings below may reference images not displayed]

FINDINGS: The heart size and mediastinal contours are within normal limits.
Both lungs are clear. The visualized skeletal structures are
unremarkable.
IMPRESSION: No active cardiopulmonary disease.

## 2017-07-06 IMAGING — CR DG FINGER LITTLE 2+V*R*
3 series · 3 of 3 positions shown · non-contrast
Comparison: None.

CLINICAL DATA: Cut distal rt little finger phalanx with knife while
cutting meat

EXAM:
RIGHT LITTLE FINGER 2+V

[finger ap]
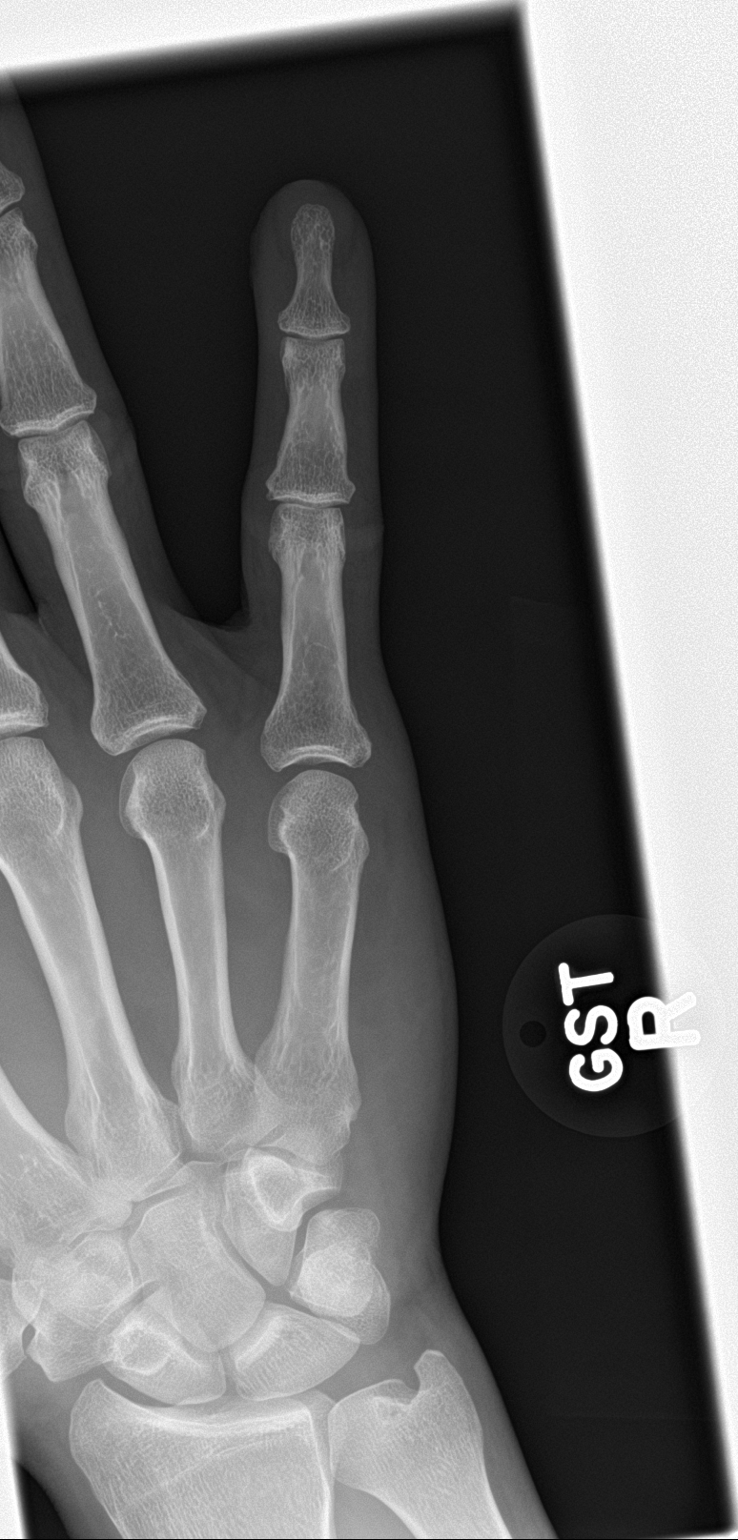

[finger obl]
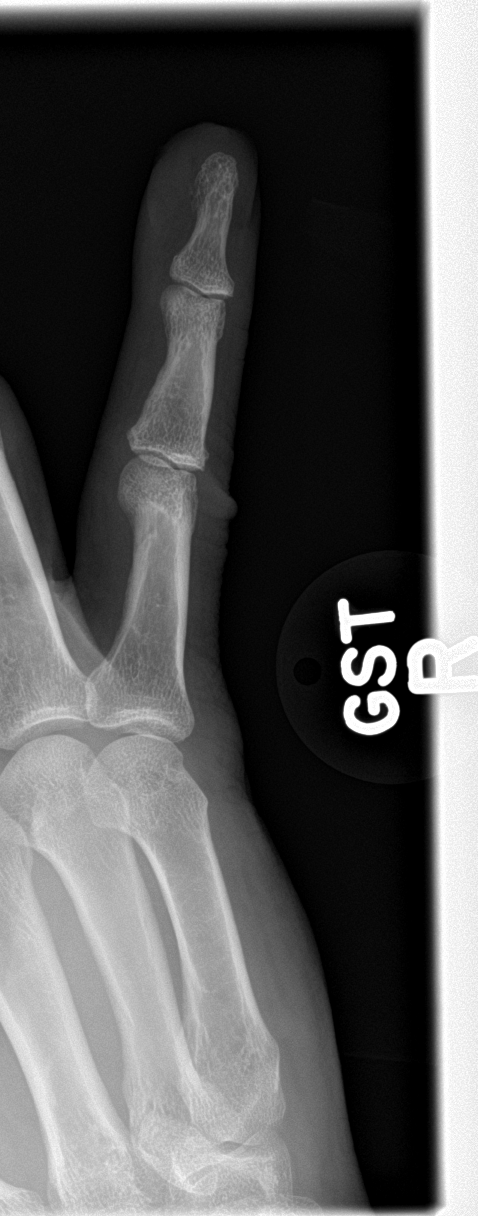

[finger lat]
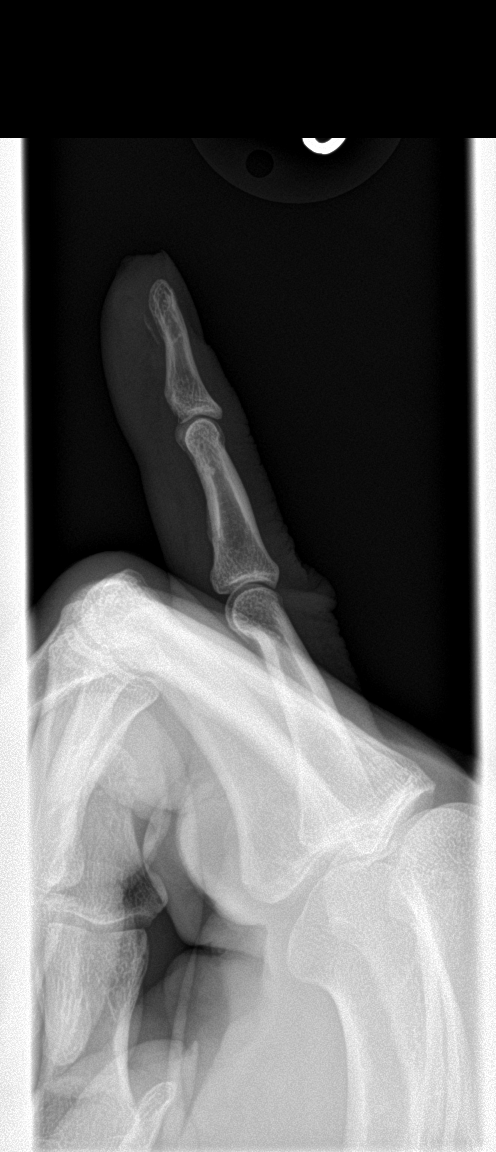

[3 of 3 positions shown; findings below may reference images not displayed]

FINDINGS: There is a linear cortical fragment anterior to the tuft of the
right little finger. Regional soft tissue swelling. No involvement
of the subchondral cortex. Otherwise normal mineralization and
alignment. No significant osseous degenerative change. No
dislocation.
IMPRESSION: Cortical injury at the palmar aspect of the tuft distal phalanx
right little finger.

## 2020-11-27 ENCOUNTER — Encounter (HOSPITAL_BASED_OUTPATIENT_CLINIC_OR_DEPARTMENT_OTHER): Payer: Self-pay | Admitting: *Deleted

## 2020-11-27 ENCOUNTER — Emergency Department (HOSPITAL_BASED_OUTPATIENT_CLINIC_OR_DEPARTMENT_OTHER)
Admission: EM | Admit: 2020-11-27 | Discharge: 2020-11-27 | Disposition: A | Discharge status: Home / Self Care | Payer: No Typology Code available for payment source | Attending: Emergency Medicine | Admitting: Emergency Medicine

## 2020-11-27 ENCOUNTER — Other Ambulatory Visit: Payer: Self-pay

## 2020-11-27 DIAGNOSIS — Z5321 Procedure and treatment not carried out due to patient leaving prior to being seen by health care provider: Secondary | ICD-10-CM | POA: Diagnosis not present

## 2020-11-27 DIAGNOSIS — R059 Cough, unspecified: Secondary | ICD-10-CM | POA: Diagnosis not present

## 2020-11-27 NOTE — ED Triage Notes (Signed)
C/o covid sx x 3 days including cough , fatigue , h/a

## 2021-09-01 ENCOUNTER — Emergency Department (HOSPITAL_BASED_OUTPATIENT_CLINIC_OR_DEPARTMENT_OTHER)
Admission: EM | Admit: 2021-09-01 | Discharge: 2021-09-01 | Disposition: A | Discharge status: Home / Self Care | Payer: No Typology Code available for payment source | Attending: Emergency Medicine | Admitting: Emergency Medicine

## 2021-09-01 ENCOUNTER — Other Ambulatory Visit: Payer: Self-pay

## 2021-09-01 ENCOUNTER — Encounter (HOSPITAL_BASED_OUTPATIENT_CLINIC_OR_DEPARTMENT_OTHER): Payer: Self-pay | Admitting: Emergency Medicine

## 2021-09-01 ENCOUNTER — Emergency Department (HOSPITAL_BASED_OUTPATIENT_CLINIC_OR_DEPARTMENT_OTHER): Payer: No Typology Code available for payment source

## 2021-09-01 DIAGNOSIS — F17200 Nicotine dependence, unspecified, uncomplicated: Secondary | ICD-10-CM | POA: Insufficient documentation

## 2021-09-01 DIAGNOSIS — I10 Essential (primary) hypertension: Secondary | ICD-10-CM | POA: Insufficient documentation

## 2021-09-01 DIAGNOSIS — R0789 Other chest pain: Secondary | ICD-10-CM | POA: Insufficient documentation

## 2021-09-01 DIAGNOSIS — Z79899 Other long term (current) drug therapy: Secondary | ICD-10-CM | POA: Insufficient documentation

## 2021-09-01 DIAGNOSIS — R0981 Nasal congestion: Secondary | ICD-10-CM | POA: Insufficient documentation

## 2021-09-01 LAB — TROPONIN I (HIGH SENSITIVITY): Troponin I (High Sensitivity): 5 ng/L (ref <18)

## 2021-09-01 LAB — CBC WITH DIFFERENTIAL/PLATELET
Abs Immature Granulocytes: 0.01 10*3/uL (ref 0.00–0.07)
Basophils Absolute: 0.1 10*3/uL (ref 0.0–0.1)
Basophils Relative: 1 %
Eosinophils Absolute: 0.1 10*3/uL (ref 0.0–0.5)
Eosinophils Relative: 2 %
HCT: 41.6 % (ref 39.0–52.0)
Hemoglobin: 14.6 g/dL (ref 13.0–17.0)
Immature Granulocytes: 0 %
Lymphocytes Relative: 32 %
Lymphs Abs: 1.9 10*3/uL (ref 0.7–4.0)
MCH: 31.1 pg (ref 26.0–34.0)
MCHC: 35.1 g/dL (ref 30.0–36.0)
MCV: 88.5 fL (ref 80.0–100.0)
Monocytes Absolute: 0.3 10*3/uL (ref 0.1–1.0)
Monocytes Relative: 5 %
Neutro Abs: 3.7 10*3/uL (ref 1.7–7.7)
Neutrophils Relative %: 60 %
Platelets: 138 10*3/uL — ABNORMAL LOW (ref 150–400)
RBC: 4.7 MIL/uL (ref 4.22–5.81)
RDW: 11.9 % (ref 11.5–15.5)
WBC: 6.1 10*3/uL (ref 4.0–10.5)
nRBC: 0 % (ref 0.0–0.2)

## 2021-09-01 LAB — COMPREHENSIVE METABOLIC PANEL
ALT: 14 U/L (ref 0–44)
AST: 20 U/L (ref 15–41)
Albumin: 3.8 g/dL (ref 3.5–5.0)
Alkaline Phosphatase: 56 U/L (ref 38–126)
Anion gap: 6 (ref 5–15)
BUN: 12 mg/dL (ref 6–20)
CO2: 25 mmol/L (ref 22–32)
Calcium: 8.8 mg/dL — ABNORMAL LOW (ref 8.9–10.3)
Chloride: 107 mmol/L (ref 98–111)
Creatinine, Ser: 0.89 mg/dL (ref 0.61–1.24)
GFR, Estimated: >60 mL/min (ref >60)
Glucose, Bld: 110 mg/dL — ABNORMAL HIGH (ref 70–99)
Potassium: 3.7 mmol/L (ref 3.5–5.1)
Sodium: 138 mmol/L (ref 135–145)
Total Bilirubin: 0.7 mg/dL (ref 0.3–1.2)
Total Protein: 6.9 g/dL (ref 6.5–8.1)

## 2021-09-01 LAB — D-DIMER, QUANTITATIVE: D-Dimer, Quant: 0.33 ug/mL-FEU (ref 0.00–0.50)

## 2021-09-01 LAB — LIPASE, BLOOD: Lipase: 23 U/L (ref 11–51)

## 2021-09-01 MED ORDER — ACETAMINOPHEN 325 MG PO TABS
650.0000 mg | ORAL_TABLET | Freq: Once | ORAL | Status: AC
  Administered 2021-09-01: 650 mg via ORAL
  Filled 2021-09-01: qty 2

## 2021-09-01 NOTE — ED Provider Notes (Signed)
[facility] EMERGENCY DEPARTMENT Provider Note   CSN: [id number] Arrival date & time: 09/01/21  2037     History  Chief Complaint  Patient presents with   Chest Pain    Tristan Kelley is a 61 y.o. male.  Is here with right-sided chest discomfort.  History of hypertension.  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  He has been having fairly constant pain for the last 3 days.  Movement makes it worse.  Ibuprofen made a little bit better.  He denies any cough or sputum production.  Denies any exertional chest pain or shortness of breath.  Denies any recent surgery or travel.  He does smoke some days.  Denies any weakness, numbness, abdominal pain, nausea, vomiting.  The history is provided by the patient.       Home Medications Prior to Admission medications   Medication Sig Start Date End Date Taking? Authorizing Provider  acetaminophen (TYLENOL) 500 MG tablet Take 500 mg by mouth every 6 (six) hours as needed for mild pain.    [provider]  amLODipine (NORVASC) 10 MG tablet Take 1 tablet (10 mg total) by mouth daily. 06/21/15   Langeland, Kathaleen Grinder, MD  aspirin-sod bicarb-citric acid (ALKA-SELTZER) 325 MG TBEF tablet Take 325 mg by mouth every 6 (six) hours as needed.    [provider]  fluticasone (FLONASE) 50 MCG/ACT nasal spray Place 2 sprays into both nostrils daily. 06/11/15   Charlynne Pander, MD  hydrochlorothiazide (HYDRODIURIL) 25 MG tablet Take 1 tablet (25 mg total) by mouth daily. 06/21/15   Pete Glatter, MD  ibuprofen (ADVIL,MOTRIN) 200 MG tablet Take 800 mg by mouth every 6 (six) hours as needed for fever. Reported on 06/28/2015    [provider]  Pheniramine-PE-APAP (THERAFLU FLU & SORE THROAT) 20-10-650 MG PACK Take 1 Package by mouth daily. Reported on 06/28/2015    [provider]  Phenyleph-Doxylamine-DM-APAP (NYQUIL SEVERE COLD/FLU) 5-6.25-10-325 MG/15ML LIQD Take 1 capsule by mouth  daily as needed (cold symptoms). Reported on 06/28/2015    [provider]      Allergies    Patient has no known allergies.    Review of Systems   Review of Systems  Physical Exam Updated Vital Signs BP (!) 174/96   Pulse 64   Temp 98.2 F (36.8 C)   Resp 19   Ht 6' 2.25" (1.886 m)   Wt 95.3 kg   SpO2 100%   BMI 26.78 kg/m  Physical Exam Vitals and nursing note reviewed.  Constitutional:      General: He is not in acute distress.    Appearance: He is well-developed. He is not ill-appearing.  HENT:     Head: Normocephalic and atraumatic.  Eyes:     Extraocular Movements: Extraocular movements intact.     Conjunctiva/sclera: Conjunctivae normal.     Pupils: Pupils are equal, round, and reactive to light.  Cardiovascular:     Rate and Rhythm: Normal rate and regular rhythm.     Pulses:          Radial pulses are 2+ on the right side and 2+ on the left side.     Heart sounds: No murmur heard. Pulmonary:     Effort: Pulmonary effort is normal. No respiratory distress.     Breath sounds: Normal breath sounds. No decreased breath sounds, wheezing or rhonchi.  Chest:     Chest wall: Tenderness present.  Abdominal:  Palpations: Abdomen is soft.     Tenderness: There is no abdominal tenderness.  Musculoskeletal:        General: No swelling.     Cervical back: Normal range of motion and neck supple.  Skin:    General: Skin is warm and dry.     Capillary Refill: Capillary refill takes less than 2 seconds.  Neurological:     General: No focal deficit present.     Mental Status: He is alert.  Psychiatric:        Mood and Affect: Mood normal.     ED Results / Procedures / Treatments   Labs (all labs ordered are listed, but only abnormal results are displayed) Labs Reviewed  CBC WITH DIFFERENTIAL/PLATELET - Abnormal; Notable for the following components:      Result Value   Platelets 138 (*)    All other components within normal limits  COMPREHENSIVE  METABOLIC PANEL - Abnormal; Notable for the following components:   Glucose, Bld 110 (*)    Calcium 8.8 (*)    All other components within normal limits  LIPASE, BLOOD  D-DIMER, QUANTITATIVE  TROPONIN I (HIGH SENSITIVITY)    EKG EKG Interpretation  Date/Time:  Saturday September 01 2021 20:47:44 EDT Ventricular Rate:  69 PR Interval:    QRS Duration: 88 QT Interval:  412 QTC Calculation: 442 R Axis:   76 Text Interpretation: Normal sinus rhythm Left ventricular hypertrophy Confirmed by Virgina Norfolk ([id number]) on 09/01/2021 8:50:43 PM  Radiology DG Chest Portable 1 View  Result Date: 09/01/2021 CLINICAL DATA:  Chest pain EXAM: PORTABLE CHEST 1 VIEW COMPARISON:  09/13/2020 FINDINGS: The heart size and mediastinal contours are within normal limits. No focal airspace consolidation, pleural effusion, or pneumothorax. The visualized skeletal structures are unremarkable. IMPRESSION: No active disease. Electronically Signed   By: Duanne Guess D.O.   On: 09/01/2021 21:27    Procedures Procedures    Medications Ordered in ED Medications  acetaminophen (TYLENOL) tablet 650 mg (650 mg Oral Given 09/01/21 2102)    ED Course/ Medical Decision Making/ A&P                           Medical Decision Making Amount and/or Complexity of Data Reviewed Labs: ordered. Radiology: ordered.  Risk OTC drugs.   Tristan Kelley is here with right-sided chest pain.  Reproducible.  Unremarkable vitals.  History of hypertension.  Patient tender over the right side chest wall.  Works a Youth worker job.  Has had some congestion however.  No exertional chest pain.  No major cardiac risk factors.  No PE risk factors.  Differential diagnosis is chest wall pain versus pneumonia versus ACS versus PE.  I have less concern for cholecystitis or pancreatitis.  Will check CBC, CMP, lipase, troponin, D-dimer, chest x-ray.  Symptoms have been ongoing persistently for 3 days.  Will give Tylenol.  Ibuprofen has helped.  Per  my review and interpretation of EKG, patient has normal sinus rhythm.  No ischemic changes.  Per my review and interpretation of labs, troponin is normal.  Doubt ACS.  D-dimer is normal and doubt PE.  No other significant anemia, electrolyte abnormality, leukocytosis.  Gallbladder and liver enzymes within normal limits.  Doubt cholecystitis.  Lipase is normal doubt pancreatitis.  No concern for kidney stone at this time.  Overall suspect musculoskeletal process.  Recommend Tylenol, ibuprofen.  Discharged in good condition.  Understands return precautions.  This chart was dictated  using voice recognition software.  Despite best efforts to proofread,  errors can occur which can change the documentation meaning.         Final Clinical Impression(s) / ED Diagnoses Final diagnoses:  Atypical chest pain    Rx / DC Orders ED Discharge Orders     None         Virgina Norfolk, DO 09/01/21 2224

## 2021-09-01 NOTE — ED Triage Notes (Signed)
Pt reports sharp pain to R rib/back area since Thursday. Pt points about 1-2 inches below R nipple to where pain is located. He also reports productive cough and chills. Unsure of fever. Afebrile on arrival. Ibuprofen taken 3 hours ago.

## 2021-09-01 NOTE — ED Notes (Signed)
Patient verbalizes understanding of discharge instructions. Opportunity for questioning and answers were provided. Armband removed by staff, pt discharged from ED. Ambulated out to lobby with wife  

## 2022-09-05 ENCOUNTER — Emergency Department (HOSPITAL_COMMUNITY): Payer: No Typology Code available for payment source

## 2022-09-05 ENCOUNTER — Other Ambulatory Visit: Payer: Self-pay

## 2022-09-05 ENCOUNTER — Emergency Department (HOSPITAL_COMMUNITY)
Admission: EM | Admit: 2022-09-05 | Discharge: 2022-09-06 | Disposition: A | Discharge status: Home / Self Care | Payer: No Typology Code available for payment source | Attending: Emergency Medicine | Admitting: Emergency Medicine

## 2022-09-05 DIAGNOSIS — M25562 Pain in left knee: Secondary | ICD-10-CM | POA: Diagnosis not present

## 2022-09-05 DIAGNOSIS — M542 Cervicalgia: Secondary | ICD-10-CM | POA: Insufficient documentation

## 2022-09-05 DIAGNOSIS — M25561 Pain in right knee: Secondary | ICD-10-CM | POA: Diagnosis present

## 2022-09-05 DIAGNOSIS — R079 Chest pain, unspecified: Secondary | ICD-10-CM | POA: Diagnosis not present

## 2022-09-05 DIAGNOSIS — Y92410 Unspecified street and highway as the place of occurrence of the external cause: Secondary | ICD-10-CM | POA: Insufficient documentation
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
## 2022-09-06 MED ORDER — MELOXICAM 15 MG PO TABS: 15.0000 mg | ORAL_TABLET | Freq: Every day | ORAL | 0 refills | Status: AC

## 2022-09-06 MED ORDER — OXYCODONE-ACETAMINOPHEN 5-325 MG PO TABS
2.0000 | ORAL_TABLET | Freq: Once | ORAL | Status: AC
  Administered 2022-09-06: 2 via ORAL
  Filled 2022-09-06: qty 2

## 2022-09-06 MED ORDER — KETOROLAC TROMETHAMINE 60 MG/2ML IM SOLN
60.0000 mg | Freq: Once | INTRAMUSCULAR | Status: AC
  Administered 2022-09-06: 60 mg via INTRAMUSCULAR
  Filled 2022-09-06: qty 2

## 2022-09-06 MED ORDER — CYCLOBENZAPRINE HCL 10 MG PO TABS: 10.0000 mg | ORAL_TABLET | Freq: Two times a day (BID) | ORAL | 0 refills | Status: AC | PRN
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
## 2022-11-19 ENCOUNTER — Other Ambulatory Visit: Payer: Self-pay

## 2022-11-19 ENCOUNTER — Ambulatory Visit (HOSPITAL_COMMUNITY)
Admission: EM | Admit: 2022-11-19 | Discharge: 2022-11-19 | Disposition: A | Discharge status: Home / Self Care | Payer: No Typology Code available for payment source | Attending: Family Medicine | Admitting: Family Medicine

## 2022-11-19 ENCOUNTER — Encounter (HOSPITAL_COMMUNITY): Payer: Self-pay | Admitting: Emergency Medicine

## 2022-11-19 DIAGNOSIS — Z20822 Contact with and (suspected) exposure to covid-19: Secondary | ICD-10-CM | POA: Diagnosis not present

## 2022-11-19 DIAGNOSIS — Z91148 Patient's other noncompliance with medication regimen for other reason: Secondary | ICD-10-CM

## 2022-11-19 DIAGNOSIS — I10 Essential (primary) hypertension: Secondary | ICD-10-CM

## 2022-11-19 LAB — SARS CORONAVIRUS 2 (TAT 6-24 HRS): SARS Coronavirus 2: POSITIVE — AB

## 2022-11-19 NOTE — ED Triage Notes (Signed)
Patient went to [place] on Wednesday/returned Saturday.  Patient had felt somewhat stuffy, but nothing major per patient.  Patient reports he received a call that family member he stayed with in [place] has tested positive for covid.    Patient wants a covid test.

## 2022-11-19 NOTE — Discharge Instructions (Addendum)
You have been tested for COVID-19 today. If your test returns positive, you will receive a phone call from [facility] regarding your results. Negative test results are not called. Both positive and negative results area always visible on MyChart. If you do not have a MyChart account, sign up instructions are provided in your discharge papers. Please do not hesitate to contact us should you have questions or concerns.  Your blood pressure was noted to be elevated during your visit today. If you are currently taking medication for high blood pressure, please ensure you are taking this as directed. If you do not have a history of high blood pressure and your blood pressure remains persistently elevated, you may need to begin taking a medication at some point. You may return here within the next few days to recheck if unable to see your primary care provider or if you do not have a one.  BP (!) 197/87 (BP Location: Right Arm) Comment (BP Location): repositioned  Pulse (!) 48   Temp 99.2 F (37.3 C) (Oral)   Resp 20   SpO2 98%   BP Readings from Last 3 Encounters:  11/19/22 (!) 197/87  09/06/22 (!) 149/82  09/01/21 (!) 201/100

## 2022-11-19 NOTE — ED Provider Notes (Signed)
[facility]   [id number] 11/19/22 Arrival Time: 1321  ASSESSMENT & PLAN:  1. Exposure to COVID-19 virus   2. Uncontrolled hypertension    COVID testing sent at pt request. No symptoms. OTC tx if he does develop symptoms.  Asymptomatic high blood pressure. Has meds; is non-compliant.    Discharge Instructions      You have been tested for COVID-19 today. If your test returns positive, you will receive a phone call from [facility] regarding your results. Negative test results are not called. Both positive and negative results area always visible on MyChart. If you do not have a MyChart account, sign up instructions are provided in your discharge papers. Please do not hesitate to contact us should you have questions or concerns.  Your blood pressure was noted to be elevated during your visit today. If you are currently taking medication for high blood pressure, please ensure you are taking this as directed. If you do not have a history of high blood pressure and your blood pressure remains persistently elevated, you may need to begin taking a medication at some point. You may return here within the next few days to recheck if unable to see your primary care provider or if you do not have a one.  BP (!) 197/87 (BP Location: Right Arm) Comment (BP Location): repositioned  Pulse (!) 48   Temp 99.2 F (37.3 C) (Oral)   Resp 20   SpO2 98%   BP Readings from Last 3 Encounters:  11/19/22 (!) 197/87  09/06/22 (!) 149/82  09/01/21 (!) 201/100        OTC symptom care as needed.  New Prescriptions   No medications on file     Follow-up Information     Schedule an appointment as soon as possible for a visit  with [facility].   Why: To recheck your blood pressure. Contact information: [address] [facility] [place] [address] [phone]                 Reviewed expectations re: course of current medical issues. Questions  answered. Outlined signs and symptoms indicating need for more acute intervention. Understanding verbalized. After Visit Summary given.   SUBJECTIVE: History from: Patient. Kailor Arizola is a 62 y.o. male. Patient went to [place] on Wednesday/returned Saturday.  Patient had felt somewhat stuffy, but nothing major.  Patient reports he received a call that family member he stayed with in [place] has tested positive for covid.  Desires covid testing. Increased blood pressure noted today. Reports that he is treated for HTN. Non-compliant with medications. Denies any symptoms.   OBJECTIVE:  Vitals:   11/19/22 1426 11/19/22 1430  BP: (!) 211/97 (!) 197/87  Pulse: (!) 52 (!) 48  Resp: 20   Temp: 99.2 F (37.3 C)   TempSrc: Oral   SpO2: 98%     General appearance: alert; no distress Eyes: PERRLA; EOMI; conjunctiva normal HENT: [place]; AT; with nasal congestion Neck: supple  Lungs: speaks full sentences without difficulty; unlabored Extremities: no edema Skin: warm and dry Neurologic: normal gait Psychological: alert and cooperative; normal mood and affect  Labs:  Labs Reviewed  SARS CORONAVIRUS 2 (TAT 6-24 HRS)    No Known Allergies  Past Medical History:  Diagnosis Date   Hypertension    Social History   Socioeconomic History   Marital status: Single    Spouse name: Not on file   Number of children: Not on file   Years of  education: Not on file   Highest education level: Not on file  Occupational History   Not on file  Tobacco Use   Smoking status: Some Days    Types: Cigarettes   Smokeless tobacco: Not on file  Vaping Use   Vaping status: Never Used  Substance and Sexual Activity   Alcohol use: Yes    Comment: occ   Drug use: No   Sexual activity: Not on file  Other Topics Concern   Not on file  Social History Narrative   Not on file   Social Determinants of Health   Financial Resource Strain: Not on file  Food Insecurity: Not on file  Transportation Needs:  Not on file  Physical Activity: Not on file  Stress: Not on file  Social Connections: Not on file  Intimate Partner Violence: Not on file   History reviewed. No pertinent family history. Past Surgical History:  Procedure Laterality Date   ANTERIOR CRUCIATE LIGAMENT REPAIR       Mardella Layman, MD 11/19/22 1450

## 2023-03-30 ENCOUNTER — Emergency Department (HOSPITAL_COMMUNITY)
Admission: EM | Admit: 2023-03-30 | Discharge: 2023-03-30 | Disposition: A | Discharge status: Home / Self Care | Payer: No Typology Code available for payment source | Attending: Emergency Medicine | Admitting: Emergency Medicine

## 2023-03-30 ENCOUNTER — Encounter (HOSPITAL_COMMUNITY): Payer: Self-pay

## 2023-03-30 ENCOUNTER — Other Ambulatory Visit: Payer: Self-pay

## 2023-03-30 DIAGNOSIS — I10 Essential (primary) hypertension: Secondary | ICD-10-CM | POA: Insufficient documentation

## 2023-03-30 DIAGNOSIS — Z79899 Other long term (current) drug therapy: Secondary | ICD-10-CM | POA: Insufficient documentation

## 2023-03-30 DIAGNOSIS — Z20822 Contact with and (suspected) exposure to covid-19: Secondary | ICD-10-CM | POA: Diagnosis not present

## 2023-03-30 DIAGNOSIS — M791 Myalgia, unspecified site: Secondary | ICD-10-CM | POA: Diagnosis present

## 2023-03-30 DIAGNOSIS — J101 Influenza due to other identified influenza virus with other respiratory manifestations: Secondary | ICD-10-CM | POA: Diagnosis not present

## 2023-03-30 LAB — RESP PANEL BY RT-PCR (RSV, FLU A&B, COVID)  RVPGX2
Influenza A by PCR: POSITIVE — AB
Influenza B by PCR: NEGATIVE
Resp Syncytial Virus by PCR: NEGATIVE
SARS Coronavirus 2 by RT PCR: NEGATIVE

## 2023-03-30 MED ORDER — ACETAMINOPHEN 500 MG PO TABS: 500.0000 mg | ORAL_TABLET | Freq: Four times a day (QID) | ORAL | 0 refills | Status: AC | PRN

## 2023-03-30 MED ORDER — OSELTAMIVIR PHOSPHATE 75 MG PO CAPS: 75.0000 mg | ORAL_CAPSULE | Freq: Two times a day (BID) | ORAL | 0 refills | Status: AC

## 2023-03-30 NOTE — Discharge Instructions (Signed)
 You have been diagnosed with influenza A.  Please take medication prescribed.  Stay hydrated, get plenty of rest.  Feel better soon.

## 2023-03-30 NOTE — ED Provider Notes (Signed)
 [place] EMERGENCY DEPARTMENT AT [facility] Provider Note   CSN: [id number] Arrival date & time: 03/30/23  1538     History  Chief Complaint  Patient presents with   URI   Emesis    Tristan Kelley is a 63 y.o. male.  The history is provided by the patient and medical records. No language interpreter was used.  URI Emesis Associated symptoms: URI      63 year old male  history of hypertension presenting with cold symptoms.  Patient report for the past 2 days he has had generalized body aches, congestion, nasal drainage, feeling nauseous, vomited twice, having some loose stools, and occasional nonproductive cough.  States he is sleeping excessively more than usual.  He felt he is catching cold but wants to make sure he is not having COVID infection.  He did take some cold medication with some relief.  He denies any significant shortness of breath no abdominal pain.  Home Medications Prior to Admission medications   Medication Sig Start Date End Date Taking? Authorizing Provider  acetaminophen  (TYLENOL ) 500 MG tablet Take 500 mg by mouth every 6 (six) hours as needed for mild pain.    [provider]  amLODipine  (NORVASC ) 10 MG tablet Take 1 tablet (10 mg total) by mouth daily. 06/21/15   Langeland, Stephane DASEN, MD  aspirin-sod bicarb-citric acid (ALKA-SELTZER) 325 MG TBEF tablet Take 325 mg by mouth every 6 (six) hours as needed.    [provider]  cyclobenzaprine  (FLEXERIL ) 10 MG tablet Take 1 tablet (10 mg total) by mouth 2 (two) times daily as needed for muscle spasms. 09/06/22   Mesner, Selinda, MD  fluticasone  (FLONASE ) 50 MCG/ACT nasal spray Place 2 sprays into both nostrils daily. Patient not taking: Reported on 11/19/2022 06/11/15   Patt Alm Macho, MD  hydrochlorothiazide  (HYDRODIURIL ) 25 MG tablet Take 1 tablet (25 mg total) by mouth daily. Patient not taking: Reported on 11/19/2022 06/21/15   Marilyne Stephane T, MD  ibuprofen (ADVIL,MOTRIN) 200 MG tablet  Take 800 mg by mouth every 6 (six) hours as needed for fever. Reported on 06/28/2015    [provider]  lisinopril  (ZESTRIL ) 40 MG tablet Take by mouth. 05/01/22   [provider]  Pheniramine-PE-APAP (THERAFLU FLU & SORE THROAT) 20-10-650 MG PACK Take 1 Package by mouth daily. Reported on 06/28/2015 Patient not taking: Reported on 11/19/2022    [provider]  Phenyleph-Doxylamine-DM-APAP (NYQUIL SEVERE COLD/FLU) 5-6.25-10-325 MG/15ML LIQD Take 1 capsule by mouth daily as needed (cold symptoms). Reported on 06/28/2015 Patient not taking: Reported on 11/19/2022    [provider]      Allergies    Patient has no known allergies.    Review of Systems   Review of Systems  Gastrointestinal:  Positive for vomiting.  All other systems reviewed and are negative.   Physical Exam Updated Vital Signs BP (!) 151/103   Pulse (!) 55   Temp 98.3 F (36.8 C) (Oral)   Resp 20   SpO2 97%  Physical Exam Vitals and nursing note reviewed.  Constitutional:      General: He is not in acute distress.    Appearance: He is well-developed.  HENT:     Head: Atraumatic.     Nose: Nose normal.     Mouth/Throat:     Mouth: Mucous membranes are moist.  Eyes:     Conjunctiva/sclera: Conjunctivae normal.  Cardiovascular:     Rate and Rhythm: Normal rate and regular rhythm.  Pulses: Normal pulses.     Heart sounds: Normal heart sounds.  Pulmonary:     Effort: Pulmonary effort is normal.     Breath sounds: Normal breath sounds. No wheezing, rhonchi or rales.  Abdominal:     Palpations: Abdomen is soft.     Tenderness: There is no abdominal tenderness.  Musculoskeletal:        General: Normal range of motion.     Cervical back: Neck supple.  Skin:    Findings: No rash.  Neurological:     Mental Status: He is alert.     ED Results / Procedures / Treatments   Labs (all labs ordered are listed, but only abnormal results are displayed) Labs Reviewed  RESP PANEL  BY RT-PCR (RSV, FLU A&B, COVID)  RVPGX2 - Abnormal; Notable for the following components:      Result Value   Influenza A by PCR POSITIVE (*)    All other components within normal limits    EKG None  Radiology No results found.  Procedures Procedures    Medications Ordered in ED Medications - No data to display  ED Course/ Medical Decision Making/ A&P                                 Medical Decision Making  BP (!) 151/103   Pulse (!) 55   Temp 98.3 F (36.8 C) (Oral)   Resp 20   SpO2 97%   30:25 PM  63 year old male  history of hypertension presenting with cold symptoms.  Patient report for the past 2 days he has had generalized body aches, congestion, nasal drainage, feeling nauseous, vomited twice, having some loose stools, and occasional nonproductive cough.  States he is sleeping excessively more than usual.  He felt he is catching cold but wants to make sure he is not having COVID infection.  He did take some cold medication with some relief.  He denies any significant shortness of breath no abdominal pain.  Exam overall reassuring, patient is resting comfortably in bed appears to be in no acute discomfort.  Heart with normal rate and rhythm, lungs clear to auscultation bilaterally abdomen is soft nontender ear nose and throat exam unremarkable.  Labs are remarkable for positive influenza A which is consistent with patient presentation.  Patient is within the window to be treated with Tamiflu .  Will provide supportive care and gave patient return precaution.  Hospital admission considered but patient is well-appearing and stable to be discharged home.  Social determinant of health including tobacco use.        Final Clinical Impression(s) / ED Diagnoses Final diagnoses:  Influenza A    Rx / DC Orders ED Discharge Orders          Ordered    oseltamivir  (TAMIFLU ) 75 MG capsule  Every 12 hours        03/30/23 1743    acetaminophen  (TYLENOL ) 500 MG tablet   Every 6 hours PRN        03/30/23 1743              Nivia Colon, PA-C 03/30/23 1745    Dasie Faden, MD 04/04/23 2334

## 2023-03-30 NOTE — ED Triage Notes (Signed)
 Pt c.o cough, congestion, chills, n/v/d for 2 days

## 2023-04-04 ENCOUNTER — Other Ambulatory Visit: Payer: Self-pay

## 2023-04-04 ENCOUNTER — Emergency Department (HOSPITAL_COMMUNITY)
Admission: EM | Admit: 2023-04-04 | Discharge: 2023-04-04 | Discharge status: Against Medical Advice | Payer: No Typology Code available for payment source | Attending: Emergency Medicine | Admitting: Emergency Medicine

## 2023-04-04 ENCOUNTER — Emergency Department (HOSPITAL_COMMUNITY): Payer: No Typology Code available for payment source

## 2023-04-04 DIAGNOSIS — R509 Fever, unspecified: Secondary | ICD-10-CM | POA: Insufficient documentation

## 2023-04-04 DIAGNOSIS — Z5321 Procedure and treatment not carried out due to patient leaving prior to being seen by health care provider: Secondary | ICD-10-CM | POA: Insufficient documentation

## 2023-04-04 DIAGNOSIS — R059 Cough, unspecified: Secondary | ICD-10-CM | POA: Diagnosis present

## 2023-04-04 DIAGNOSIS — R0981 Nasal congestion: Secondary | ICD-10-CM | POA: Diagnosis not present

## 2023-04-04 NOTE — ED Triage Notes (Signed)
Pt. Having upper resp. Symptoms, productive cough, congestion, lt. Side chest pain when coughing.  Nasal congestion. Pt. Reports intermittent fevers and can here rattling and wheezing in his chest.

## 2023-06-28 ENCOUNTER — Emergency Department (HOSPITAL_BASED_OUTPATIENT_CLINIC_OR_DEPARTMENT_OTHER)
Admission: EM | Admit: 2023-06-28 | Discharge: 2023-06-28 | Disposition: A | Discharge status: Home / Self Care | Attending: Emergency Medicine | Admitting: Emergency Medicine

## 2023-06-28 ENCOUNTER — Emergency Department (HOSPITAL_BASED_OUTPATIENT_CLINIC_OR_DEPARTMENT_OTHER)

## 2023-06-28 ENCOUNTER — Other Ambulatory Visit: Payer: Self-pay

## 2023-06-28 ENCOUNTER — Encounter (HOSPITAL_BASED_OUTPATIENT_CLINIC_OR_DEPARTMENT_OTHER): Payer: Self-pay | Admitting: Radiology

## 2023-06-28 DIAGNOSIS — R0602 Shortness of breath: Secondary | ICD-10-CM | POA: Diagnosis not present

## 2023-06-28 DIAGNOSIS — Z5321 Procedure and treatment not carried out due to patient leaving prior to being seen by health care provider: Secondary | ICD-10-CM | POA: Insufficient documentation

## 2023-06-28 DIAGNOSIS — R079 Chest pain, unspecified: Secondary | ICD-10-CM | POA: Insufficient documentation

## 2023-06-28 LAB — CBC
HCT: 43.5 % (ref 39.0–52.0)
Hemoglobin: 15 g/dL (ref 13.0–17.0)
MCH: 30.7 pg (ref 26.0–34.0)
MCHC: 34.5 g/dL (ref 30.0–36.0)
MCV: 89.1 fL (ref 80.0–100.0)
Platelets: 169 10*3/uL (ref 150–400)
RBC: 4.88 MIL/uL (ref 4.22–5.81)
RDW: 12.5 % (ref 11.5–15.5)
WBC: 5.5 10*3/uL (ref 4.0–10.5)
nRBC: 0 % (ref 0.0–0.2)

## 2023-06-28 LAB — BASIC METABOLIC PANEL WITH GFR
Anion gap: 8 (ref 5–15)
BUN: 15 mg/dL (ref 8–23)
CO2: 27 mmol/L (ref 22–32)
Calcium: 9.2 mg/dL (ref 8.9–10.3)
Chloride: 105 mmol/L (ref 98–111)
Creatinine, Ser: 0.79 mg/dL (ref 0.61–1.24)
GFR, Estimated: >60 mL/min (ref >60)
Glucose, Bld: 112 mg/dL — ABNORMAL HIGH (ref 70–99)
Potassium: 3.5 mmol/L (ref 3.5–5.1)
Sodium: 140 mmol/L (ref 135–145)

## 2023-06-28 LAB — MAGNESIUM: Magnesium: 1.9 mg/dL (ref 1.7–2.4)

## 2023-06-28 LAB — TROPONIN I (HIGH SENSITIVITY): Troponin I (High Sensitivity): 8 ng/L (ref <18)

## 2023-06-28 NOTE — ED Notes (Signed)
2nd call in [place], no answer

## 2023-06-28 NOTE — ED Triage Notes (Signed)
 Pt BP elevated in triage and patient has a history of the same. States he is supposed to take BP meds but didn't take it today.

## 2023-06-28 NOTE — ED Triage Notes (Signed)
 States when he takes a deep breath his chest hurts and when he walks he becomes short of breath with increased chest pain. This has been going on since Tuesday.

## 2023-06-28 NOTE — ED Notes (Signed)
Called in [facility], no answer.

## 2023-06-28 NOTE — ED Provider Notes (Signed)
 63 year old male who presented to the emergency department with chest pain. Had initial EKG, chest x-ray and labs ordered.  30 minutes after triage he did not come back to his room.  He had his x-ray and walked out of the department prior to being physically placed in the room he had been assigned.  I had wanted a repeat EKG given artifact present on first.  I did review his labs and x-ray after he left the department which showed normal troponin no clinically significant electrolyte abnormalities, anemia or leukocytosis.  His chest x-ray showed no evidence of pneumonia, pneumothorax.  Secretary and I attempted to call him.  He eloped prior to evaluation.    Scarlette Currier, MD 06/28/23 [phone]

## 2023-06-28 NOTE — ED Notes (Signed)
3rd call in [facility] , no answer 

## 2023-09-20 ENCOUNTER — Emergency Department (HOSPITAL_COMMUNITY)
Admission: EM | Admit: 2023-09-20 | Discharge: 2023-09-20 | Disposition: A | Discharge status: Home / Self Care | Attending: Emergency Medicine | Admitting: Emergency Medicine

## 2023-09-20 ENCOUNTER — Encounter (HOSPITAL_COMMUNITY): Payer: Self-pay | Admitting: *Deleted

## 2023-09-20 ENCOUNTER — Emergency Department (HOSPITAL_COMMUNITY)

## 2023-09-20 ENCOUNTER — Other Ambulatory Visit: Payer: Self-pay

## 2023-09-20 DIAGNOSIS — Z7982 Long term (current) use of aspirin: Secondary | ICD-10-CM | POA: Insufficient documentation

## 2023-09-20 DIAGNOSIS — R059 Cough, unspecified: Secondary | ICD-10-CM | POA: Diagnosis present

## 2023-09-20 DIAGNOSIS — Z79899 Other long term (current) drug therapy: Secondary | ICD-10-CM | POA: Insufficient documentation

## 2023-09-20 DIAGNOSIS — R0981 Nasal congestion: Secondary | ICD-10-CM | POA: Insufficient documentation

## 2023-09-20 DIAGNOSIS — I10 Essential (primary) hypertension: Secondary | ICD-10-CM | POA: Diagnosis not present

## 2023-09-20 DIAGNOSIS — R058 Other specified cough: Secondary | ICD-10-CM

## 2023-09-20 LAB — CBC WITH DIFFERENTIAL/PLATELET
Abs Immature Granulocytes: 0.01 K/uL (ref 0.00–0.07)
Basophils Absolute: 0.1 K/uL (ref 0.0–0.1)
Basophils Relative: 1 %
Eosinophils Absolute: 0.1 K/uL (ref 0.0–0.5)
Eosinophils Relative: 2 %
HCT: 40.4 % (ref 39.0–52.0)
Hemoglobin: 13.9 g/dL (ref 13.0–17.0)
Immature Granulocytes: 0 %
Lymphocytes Relative: 47 %
Lymphs Abs: 2.7 K/uL (ref 0.7–4.0)
MCH: 31.4 pg (ref 26.0–34.0)
MCHC: 34.4 g/dL (ref 30.0–36.0)
MCV: 91.4 fL (ref 80.0–100.0)
Monocytes Absolute: 0.4 K/uL (ref 0.1–1.0)
Monocytes Relative: 7 %
Neutro Abs: 2.5 K/uL (ref 1.7–7.7)
Neutrophils Relative %: 43 %
Platelets: 138 K/uL — ABNORMAL LOW (ref 150–400)
RBC: 4.42 MIL/uL (ref 4.22–5.81)
RDW: 12.5 % (ref 11.5–15.5)
WBC: 5.8 K/uL (ref 4.0–10.5)
nRBC: 0 % (ref 0.0–0.2)

## 2023-09-20 LAB — RESP PANEL BY RT-PCR (RSV, FLU A&B, COVID)  RVPGX2
Influenza A by PCR: NEGATIVE
Influenza B by PCR: NEGATIVE
Resp Syncytial Virus by PCR: NEGATIVE
SARS Coronavirus 2 by RT PCR: NEGATIVE

## 2023-09-20 LAB — BASIC METABOLIC PANEL WITH GFR
Anion gap: 8 (ref 5–15)
BUN: 15 mg/dL (ref 8–23)
CO2: 23 mmol/L (ref 22–32)
Calcium: 8.8 mg/dL — ABNORMAL LOW (ref 8.9–10.3)
Chloride: 108 mmol/L (ref 98–111)
Creatinine, Ser: 0.76 mg/dL (ref 0.61–1.24)
GFR, Estimated: >60 mL/min (ref >60)
Glucose, Bld: 118 mg/dL — ABNORMAL HIGH (ref 70–99)
Potassium: 3.6 mmol/L (ref 3.5–5.1)
Sodium: 139 mmol/L (ref 135–145)

## 2023-09-20 MED ORDER — AMLODIPINE BESYLATE 5 MG PO TABS
10.0000 mg | ORAL_TABLET | Freq: Once | ORAL | Status: AC
  Administered 2023-09-20: 10 mg via ORAL
  Filled 2023-09-20: qty 2

## 2023-09-20 MED ORDER — LISINOPRIL 20 MG PO TABS
40.0000 mg | ORAL_TABLET | Freq: Once | ORAL | Status: AC
  Administered 2023-09-20: 40 mg via ORAL
  Filled 2023-09-20: qty 2

## 2023-09-20 NOTE — Discharge Instructions (Signed)
 It was a pleasure to care for you today.  Based on your history, physical exam, labs, and imaging I feel you are safe for discharge.  Today an acute cause for your symptoms was not found.  You were given your home dose of blood pressure medication and we saw improvement in your blood pressure.  There was no evidence of pneumonia on your chest x-ray, your vital signs are stable and you are afebrile.  Please continue to monitor your symptoms at home and follow-up with your primary care provider and specialist as scheduled or sooner if symptoms warrant.  Please return to the emergency department or seek further medical care if you experiencing the following symptoms including but not limited to fever, chills, chest pain, shortness of breath, abdominal pain, coughing up blood, severe headache, visual disturbances or other concerning symptoms. At home you may use an antihistamine medication like zyrtec and flonase  to help with symptoms. You may also take over the counter tylenol  as needed.

## 2023-09-20 NOTE — ED Triage Notes (Signed)
 The pt ran out of his bp meds 2 days ago

## 2023-09-20 NOTE — ED Triage Notes (Signed)
 The pt has had cough congestion and body pain for 2-3 days unknown temp

## 2023-09-20 NOTE — ED Notes (Signed)
 Pt in bed, pt denies pain, pt denies chest pain or shortness of breath, PA and MD notified of blood pressure, pt is ok for discharge, pt states that he is ready to go home, pt verbalized understanding d/c and follow up, PA at bedside talking with pt about blood pressure medications with pt, pt verbalized understanding, states that he will call the [facility] tomorrow to get his refills. Pt ambulatory from department.

## 2023-09-20 NOTE — ED Provider Notes (Signed)
 [place] EMERGENCY DEPARTMENT AT [facility] Provider Note   CSN: [id number] Arrival date & time: 09/20/23  1537     Patient presents with: No chief complaint on file.   Tristan Kelley is a 63 y.o. male who presents emergency department with a chief complaint of cough and congestion as well as body pain for approximately 2 to 3 days.  Patient unaware if he has had a fever at home, however does appreciate some chills.  Patient also states that he ran out of his blood pressure medicines 2 days ago, stating that he takes lisinopril  and amlodipine  at home.  He states that he ran out of his medications because of the holiday and has not been able to get a refill.  Denies chest pain, shortness of breath, abdominal pain, headache, visual disturbances.  Patient does appreciate a productive cough, stating that he has been coughing up yellow/green phlegm. Denies coughing up blood or nausea/vomiting.    HPI    Prior to Admission medications   Medication Sig Start Date End Date Taking? Authorizing Provider  acetaminophen  (TYLENOL ) 500 MG tablet Take 1 tablet (500 mg total) by mouth every 6 (six) hours as needed for mild pain (pain score 1-3), fever or headache. 03/30/23   Nivia Colon, PA-C  amLODipine  (NORVASC ) 10 MG tablet Take 1 tablet (10 mg total) by mouth daily. 06/21/15   Langeland, Stephane DASEN, MD  aspirin-sod bicarb-citric acid (ALKA-SELTZER) 325 MG TBEF tablet Take 325 mg by mouth every 6 (six) hours as needed.    [provider]  cyclobenzaprine  (FLEXERIL ) 10 MG tablet Take 1 tablet (10 mg total) by mouth 2 (two) times daily as needed for muscle spasms. 09/06/22   Mesner, Selinda, MD  fluticasone  (FLONASE ) 50 MCG/ACT nasal spray Place 2 sprays into both nostrils daily. Patient not taking: Reported on 11/19/2022 06/11/15   Patt Alm Macho, MD  hydrochlorothiazide  (HYDRODIURIL ) 25 MG tablet Take 1 tablet (25 mg total) by mouth daily. Patient not taking: Reported on 11/19/2022 06/21/15    Marilyne Stephane T, MD  ibuprofen (ADVIL,MOTRIN) 200 MG tablet Take 800 mg by mouth every 6 (six) hours as needed for fever. Reported on 06/28/2015    [provider]  lisinopril  (ZESTRIL ) 40 MG tablet Take by mouth. 05/01/22   [provider]  oseltamivir  (TAMIFLU ) 75 MG capsule Take 1 capsule (75 mg total) by mouth every 12 (twelve) hours. 03/30/23   Nivia Colon, PA-C  Pheniramine-PE-APAP (THERAFLU FLU & SORE THROAT) 20-10-650 MG PACK Take 1 Package by mouth daily. Reported on 06/28/2015 Patient not taking: Reported on 11/19/2022    [provider]  Phenyleph-Doxylamine-DM-APAP (NYQUIL SEVERE COLD/FLU) 5-6.25-10-325 MG/15ML LIQD Take 1 capsule by mouth daily as needed (cold symptoms). Reported on 06/28/2015 Patient not taking: Reported on 11/19/2022    [provider]    Allergies: Patient has no known allergies.    Review of Systems  HENT:  Positive for congestion.     Updated Vital Signs BP (!) 194/103 (BP Location: Right Arm)   Pulse 62   Temp 97.9 F (36.6 C) (Oral)   Resp 17   Ht 6' 2 (1.88 m)   Wt 93 kg   SpO2 99%   BMI 26.32 kg/m   Physical Exam Vitals and nursing note reviewed.  Constitutional:      General: He is awake. He is not in acute distress.    Appearance: Normal appearance. He is not ill-appearing, toxic-appearing or diaphoretic.  HENT:  Head: Normocephalic and atraumatic.  Eyes:     General: No scleral icterus. Cardiovascular:     Rate and Rhythm: Normal rate and regular rhythm.  Pulmonary:     Effort: Pulmonary effort is normal. No respiratory distress.     Breath sounds: Normal breath sounds. No wheezing or rales.  Abdominal:     General: Abdomen is flat.     Palpations: Abdomen is soft.     Tenderness: There is no abdominal tenderness. There is no guarding or rebound.  Musculoskeletal:        General: Normal range of motion.     Cervical back: Normal range of motion.     Right lower leg: No edema.     Left lower  leg: No edema.  Skin:    General: Skin is warm and dry.     Capillary Refill: Capillary refill takes less than 2 seconds.  Neurological:     General: No focal deficit present.     Mental Status: He is alert and oriented to person, place, and time.  Psychiatric:        Mood and Affect: Mood normal.        Behavior: Behavior normal. Behavior is cooperative.     (all labs ordered are listed, but only abnormal results are displayed) Labs Reviewed  CBC WITH DIFFERENTIAL/PLATELET - Abnormal; Notable for the following components:      Result Value   Platelets 138 (*)    All other components within normal limits  BASIC METABOLIC PANEL WITH GFR - Abnormal; Notable for the following components:   Glucose, Bld 118 (*)    Calcium 8.8 (*)    All other components within normal limits  RESP PANEL BY RT-PCR (RSV, FLU A&B, COVID)  RVPGX2    EKG: EKG Interpretation Date/Time:  Saturday September 20 2023 16:52:54 EDT Ventricular Rate:  48 PR Interval:  206 QRS Duration:  97 QT Interval:  464 QTC Calculation: 415 R Axis:   34  Text Interpretation: Sinus bradycardia Atrial premature complex Probable left atrial enlargement Abnormal R-wave progression, early transition Probable anterolateral infarct, old Confirmed by Melvenia Motto [phone]) on 09/20/2023 5:57:02 PM  Radiology: DG Chest 2 View Result Date: 09/20/2023 CLINICAL DATA:  Cough and congestion with body pain for 2-3 days. EXAM: CHEST - 2 VIEW COMPARISON:  06/28/2023. FINDINGS: The heart size and mediastinal contours are within normal limits. No consolidation, effusion, or pneumothorax. No acute osseous abnormality. IMPRESSION: No active cardiopulmonary disease. Electronically Signed   By: Leita Birmingham M.D.   On: 09/20/2023 16:57     Procedures   Medications Ordered in the ED  lisinopril  (ZESTRIL ) tablet 40 mg (40 mg Oral Given 09/20/23 1705)  amLODipine  (NORVASC ) tablet 10 mg (10 mg Oral Given 09/20/23 1705)                                     Medical Decision Making Amount and/or Complexity of Data Reviewed Labs: ordered. Radiology: ordered.  Risk Prescription drug management.   Patient presents to the ED for concern of hypertension, cough and congestion for approximately 2 to 3 days, this involves an extensive number of treatment options, and is a complaint that carries with it a high risk of complications and morbidity.  The differential diagnosis includes essential hypertension, viral syndrome, bacterial sinusitis, pneumonia, etc.   Co morbidities that complicate the patient evaluation  Hypertension  Lab Tests:  I Ordered, and personally interpreted labs.  The pertinent results include: CBC and BMP unremarkable, respiratory panel negative   Imaging Studies ordered:  I ordered imaging studies including chest x-ray I independently visualized and interpreted imaging which showed no active cardiopulmonary disease I agree with the radiologist interpretation   Cardiac Monitoring:  EKG showed sinus bradycardia, no obvious ST segment elevation by my interpretation   Medicines ordered and prescription drug management:  I ordered medication including lisinopril  and amlodipine  for hypertension  Reevaluation of the patient after these medicines showed that the patient improved I have reviewed the patients home medicines and have made adjustments as needed   Test Considered:  None   Critical Interventions:  None   Problem List / ED Course:  63 year old male, cough and congestion for 2 to 3 days, hypertension and unable to get medication refill due to holiday Patient overall very well-appearing, stable vital signs although hypertensive, patient states that he has not been able to acquire his home medications of lisinopril  and amlodipine  Overall benign physical exam, no obvious abnormalities with auscultation of heart and lungs, no lower extremity swelling appreciated, patient grossly neurologically intact,  denies visual disturbances Patient afebrile in the emergency department today, oxygen saturation as expected, not tachypneic, not tachycardic, low clinical suspicion for sepsis or pulmonary embolism Most likely diagnosis for patient's symptoms is elevated blood pressure due to him not taking his blood pressure medications as well as a possible viral syndrome, patient instructed to continue to monitor symptoms Patient viral respiratory panel negative for COVID, flu, RSV Patient educated on workup and findings today.  Encouraged to try symptomatic treatment at home for symptoms including an oral antihistamine such as Zyrtec as well as Flonase , patient also instructed that he can take Tylenol  for pain and discomfort.  Patient educated that since this is day 2-3 of his symptoms a viral cause is the most likely at this time, however if symptoms persist beyond 7 days a bacterial infection may be the cause and he should seek further assessment and treatment. Return precautions given, patient instructed to pick up his medicines for high blood pressure from the pharmacy and get in contact with his primary care provider. Patient discharged Most likely diagnosis in the emergency department today is elevated blood pressure due to patient missing medications, also possible viral syndrome contributing to symptoms.   Reevaluation:  After the interventions noted above, I reevaluated the patient and found that they have :improved   Social Determinants of Health:  None   Dispostion:  After consideration of the diagnostic results and the patients response to treatment, I feel that the patent would benefit from discharge and picking up his prescribed blood pressure medications at the pharmacy.  Recommend follow-up with primary care and specialist as scheduled, sooner if symptoms warrant.     Final diagnoses:  Hypertension, unspecified type  Productive cough  Nasal congestion    ED Discharge Orders      None          Janetta Terrall JULIANNA DEVONNA 09/20/23 2129    Melvenia Motto, MD 09/20/23 2130

## 2023-10-02 ENCOUNTER — Emergency Department (HOSPITAL_COMMUNITY)

## 2023-10-02 ENCOUNTER — Encounter (HOSPITAL_COMMUNITY): Payer: Self-pay

## 2023-10-02 ENCOUNTER — Emergency Department (HOSPITAL_COMMUNITY)
Admission: EM | Admit: 2023-10-02 | Discharge: 2023-10-02 | Disposition: A | Discharge status: Home / Self Care | Attending: Emergency Medicine | Admitting: Emergency Medicine

## 2023-10-02 ENCOUNTER — Other Ambulatory Visit: Payer: Self-pay

## 2023-10-02 DIAGNOSIS — R0789 Other chest pain: Secondary | ICD-10-CM | POA: Diagnosis present

## 2023-10-02 DIAGNOSIS — R079 Chest pain, unspecified: Secondary | ICD-10-CM

## 2023-10-02 DIAGNOSIS — F17200 Nicotine dependence, unspecified, uncomplicated: Secondary | ICD-10-CM | POA: Insufficient documentation

## 2023-10-02 DIAGNOSIS — I10 Essential (primary) hypertension: Secondary | ICD-10-CM | POA: Diagnosis not present

## 2023-10-02 DIAGNOSIS — Z7982 Long term (current) use of aspirin: Secondary | ICD-10-CM | POA: Insufficient documentation

## 2023-10-02 DIAGNOSIS — Z79899 Other long term (current) drug therapy: Secondary | ICD-10-CM | POA: Insufficient documentation

## 2023-10-02 LAB — BASIC METABOLIC PANEL WITH GFR
Anion gap: 11 (ref 5–15)
BUN: 18 mg/dL (ref 8–23)
CO2: 22 mmol/L (ref 22–32)
Calcium: 9.5 mg/dL (ref 8.9–10.3)
Chloride: 105 mmol/L (ref 98–111)
Creatinine, Ser: 0.9 mg/dL (ref 0.61–1.24)
GFR, Estimated: >60 mL/min (ref >60)
Glucose, Bld: 152 mg/dL — ABNORMAL HIGH (ref 70–99)
Potassium: 3 mmol/L — ABNORMAL LOW (ref 3.5–5.1)
Sodium: 138 mmol/L (ref 135–145)

## 2023-10-02 LAB — CBC
HCT: 43.7 % (ref 39.0–52.0)
Hemoglobin: 14.8 g/dL (ref 13.0–17.0)
MCH: 30.7 pg (ref 26.0–34.0)
MCHC: 33.9 g/dL (ref 30.0–36.0)
MCV: 90.7 fL (ref 80.0–100.0)
Platelets: 175 K/uL (ref 150–400)
RBC: 4.82 MIL/uL (ref 4.22–5.81)
RDW: 12.5 % (ref 11.5–15.5)
WBC: 6 K/uL (ref 4.0–10.5)
nRBC: 0 % (ref 0.0–0.2)

## 2023-10-02 LAB — TROPONIN I (HIGH SENSITIVITY)
Troponin I (High Sensitivity): 13 ng/L (ref <18)
Troponin I (High Sensitivity): 15 ng/L (ref <18)

## 2023-10-02 MED ORDER — KETOROLAC TROMETHAMINE 15 MG/ML IJ SOLN
15.0000 mg | Freq: Once | INTRAMUSCULAR | Status: AC
  Administered 2023-10-02: 15 mg via INTRAMUSCULAR
  Filled 2023-10-02: qty 1

## 2023-10-02 MED ORDER — POTASSIUM CHLORIDE CRYS ER 20 MEQ PO TBCR
40.0000 meq | EXTENDED_RELEASE_TABLET | Freq: Once | ORAL | Status: AC
Start: 2023-10-02 — End: 2023-10-02
  Administered 2023-10-02: 40 meq via ORAL
  Filled 2023-10-02: qty 2

## 2023-10-02 NOTE — ED Triage Notes (Signed)
 Patient complains of left chest pain but reports its the bone and its like a burning sensation.  Reports there is a rash there.  Denies SOB, cough, sweating dizziness,

## 2023-10-02 NOTE — ED Provider Notes (Signed)
 [place] EMERGENCY DEPARTMENT AT [facility] Provider Note   CSN: [id number] Arrival date & time: 10/02/23  1302     Patient presents with: Chest Pain   Tristan Kelley is a 63 y.o. male.  Patient with past medical history significant for hypertension presents to the emergency room complaining of left-sided chest pain.  He states it feels like it may be his bone or muscle.  He describes an occasional burning sensation.  He does report a rash with a small bump under his left pectoral muscle.  He denies abdominal pain, nausea, vomiting, lightheadedness, fever, shortness of breath.  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  He feels he may have strained a muscle.    Chest Pain      Prior to Admission medications   Medication Sig Start Date End Date Taking? Authorizing Provider  acetaminophen  (TYLENOL ) 500 MG tablet Take 1 tablet (500 mg total) by mouth every 6 (six) hours as needed for mild pain (pain score 1-3), fever or headache. 03/30/23   Nivia Colon, PA-C  amLODipine  (NORVASC ) 10 MG tablet Take 1 tablet (10 mg total) by mouth daily. 06/21/15   Langeland, Stephane DASEN, MD  aspirin-sod bicarb-citric acid (ALKA-SELTZER) 325 MG TBEF tablet Take 325 mg by mouth every 6 (six) hours as needed.    [provider]  cyclobenzaprine  (FLEXERIL ) 10 MG tablet Take 1 tablet (10 mg total) by mouth 2 (two) times daily as needed for muscle spasms. 09/06/22   Mesner, Selinda, MD  fluticasone  (FLONASE ) 50 MCG/ACT nasal spray Place 2 sprays into both nostrils daily. Patient not taking: Reported on 11/19/2022 06/11/15   Patt Alm Macho, MD  hydrochlorothiazide  (HYDRODIURIL ) 25 MG tablet Take 1 tablet (25 mg total) by mouth daily. Patient not taking: Reported on 11/19/2022 06/21/15   Marilyne Stephane T, MD  ibuprofen (ADVIL,MOTRIN) 200 MG tablet Take 800 mg by mouth every 6 (six) hours as needed for fever. Reported on 06/28/2015    [provider]   lisinopril  (ZESTRIL ) 40 MG tablet Take by mouth. 05/01/22   [provider]  oseltamivir  (TAMIFLU ) 75 MG capsule Take 1 capsule (75 mg total) by mouth every 12 (twelve) hours. 03/30/23   Nivia Colon, PA-C  Pheniramine-PE-APAP (THERAFLU FLU & SORE THROAT) 20-10-650 MG PACK Take 1 Package by mouth daily. Reported on 06/28/2015 Patient not taking: Reported on 11/19/2022    [provider]  Phenyleph-Doxylamine-DM-APAP (NYQUIL SEVERE COLD/FLU) 5-6.25-10-325 MG/15ML LIQD Take 1 capsule by mouth daily as needed (cold symptoms). Reported on 06/28/2015 Patient not taking: Reported on 11/19/2022    [provider]    Allergies: Patient has no known allergies.    Review of Systems  Cardiovascular:  Positive for chest pain.    Updated Vital Signs BP (!) 157/82   Pulse 60   Temp 98 F (36.7 C) (Oral)   Resp (!) 25   Ht 6' 2 (1.88 m)   Wt 94.3 kg   SpO2 100%   BMI 26.71 kg/m   Physical Exam Vitals and nursing note reviewed.  Constitutional:      General: He is not in acute distress.    Appearance: He is well-developed.  HENT:     Head: Normocephalic and atraumatic.  Eyes:     Conjunctiva/sclera: Conjunctivae normal.  Cardiovascular:     Rate and Rhythm: Normal rate and regular rhythm.     Heart sounds: No murmur heard. Pulmonary:  Effort: Pulmonary effort is normal. No respiratory distress.     Breath sounds: Normal breath sounds.  Chest:     Chest wall: Tenderness present.     Comments: Mild left-sided chest tenderness Abdominal:     Palpations: Abdomen is soft.     Tenderness: There is no abdominal tenderness.  Musculoskeletal:        General: No swelling.     Cervical back: Neck supple.  Skin:    General: Skin is warm and dry.     Capillary Refill: Capillary refill takes less than 2 seconds.     Comments: 1 isolated papule located under left pectoral muscle.  No widespread rash appreciated  Neurological:     Mental Status: He is alert.   Psychiatric:        Mood and Affect: Mood normal.     (all labs ordered are listed, but only abnormal results are displayed) Labs Reviewed  BASIC METABOLIC PANEL WITH GFR - Abnormal; Notable for the following components:      Result Value   Potassium 3.0 (*)    Glucose, Bld 152 (*)    All other components within normal limits  CBC  TROPONIN I (HIGH SENSITIVITY)  TROPONIN I (HIGH SENSITIVITY)    EKG: None  Radiology: DG Chest 2 View Result Date: 10/02/2023 CLINICAL DATA:  cp EXAM: CHEST - 2 VIEW COMPARISON:  None available. FINDINGS: No focal airspace consolidation, pleural effusion, or pneumothorax. No cardiomegaly. Tortuous aorta with aortic atherosclerosis. No acute fracture or destructive lesion. Multilevel degenerative disc disease of the spine. IMPRESSION: No acute cardiopulmonary abnormality. Electronically Signed   By: Rogelia Myers M.D.   On: 10/02/2023 15:23     Procedures   Medications Ordered in the ED  potassium chloride  SA (KLOR-CON  M) CR tablet 40 mEq (has no administration in time range)  ketorolac  (TORADOL ) 15 MG/ML injection 15 mg (15 mg Intramuscular Given 10/02/23 2235)                                    Medical Decision Making Risk Prescription drug management.   This patient presents to the ED for concern of chest pain, this involves an extensive number of treatment options, and is a complaint that carries with it a high risk of complications and morbidity.  The differential diagnosis includes musculoskeletal pain, ACS, pneumonia, PE, anxiety, others   Co morbidities / Chronic conditions that complicate the patient evaluation  Hypertension   Additional history obtained:  Additional history obtained from EMR   Lab Tests:  I Ordered, and personally interpreted labs.  The pertinent results include: Initial troponin 13, repeat 15, potassium 3.0   Imaging Studies ordered:  I ordered imaging studies including chest x-ray  I independently  visualized and interpreted imaging which showed no acute abnormality I agree with the radiologist interpretation   Cardiac Monitoring: / EKG:  The patient was maintained on a cardiac monitor.  I personally viewed and interpreted the cardiac monitored which showed an underlying rhythm of: sinus rhythm   Problem List / ED Course / Critical interventions / Medication management   I ordered medication including toradol , potassium   Reevaluation of the patient after these medicines showed that the patient improved I have reviewed the patients home medicines and have made adjustments as needed   Social Determinants of Health:  Patient is an occasional tobacco smoker   Test / Admission - Considered:  Patient with negative troponins x 2 and nonischemic EKG.  He describes the pain as both tenderness and burning.  Story not consistent with PE.  He also has no tachycardia and no shortness of breath.  Chest x-ray is unremarkable.  He has mild hypokalemia with a potassium of 3.0.  Presentation most consistent with musculoskeletal pain.  Patient may have strained a pectoral muscle while at work.  He was concerned about the rash but there was only 1 isolated bump under his left pec, no sign of shingles.  I did consider possible shingles pain.  At this time patient appears stable for discharge.  He plans to follow-up with the [facility].  He has been given return precautions.      Final diagnoses:  Nonspecific chest pain    ED Discharge Orders     None          Logan Ubaldo KATHEE DEVONNA 10/02/23 2246    Emil Share, DO 10/02/23 2248

## 2023-10-02 NOTE — Discharge Instructions (Signed)
 Your workup this evening was reassuring.  This seems likely to be musculoskeletal pain.  You may take anti-inflammatory medication such as ibuprofen and medication such as Tylenol  for pain relief.  I recommend following up with your primary care provider.  If you develop shortness of breath or other life-threatening symptoms please return to the emergency department.

## 2023-10-02 NOTE — ED Provider Triage Note (Signed)
 Emergency Medicine Provider Triage Evaluation Note  Morrell Laverdiere , a 63 y.o. male  was evaluated in triage.  Pt complains of cp. Pain to left side of chest, burning, ongoing x 3 days.  Also noticed a rash. No fever, chills, cough, sob.  No hx of shingles  Review of Systems  Positive: As above Negative: As above  Physical Exam  BP (!) 148/80 (BP Location: Right Arm)   Pulse 64   Temp 98.5 F (36.9 C)   Resp 18   Ht 6' 2 (1.88 m)   Wt 94.3 kg   SpO2 97%   BMI 26.71 kg/m  Gen:   Awake, no distress   Resp:  Normal effort  MSK:   Moves extremities without difficulty  Other:  No obvious vesicular rash appreciated  Medical Decision Making  Medically screening exam initiated at 2:24 PM.  Appropriate orders placed.  Justn Mandt was informed that the remainder of the evaluation will be completed by another provider, this initial triage assessment does not replace that evaluation, and the importance of remaining in the ED until their evaluation is complete.  Possible shingles   Nivia Colon, PA-C 10/02/23 1425
